# Patient Record
Sex: Male | Born: 1960
Health system: Southern US, Community
[De-identification: ages and names within clinical notes are randomized; demographics above are authoritative.]

## PROBLEM LIST (undated history)

## (undated) DIAGNOSIS — F17200 Nicotine dependence, unspecified, uncomplicated: Secondary | ICD-10-CM

## (undated) DIAGNOSIS — M19019 Primary osteoarthritis, unspecified shoulder: Secondary | ICD-10-CM

## (undated) DIAGNOSIS — S42009A Fracture of unspecified part of unspecified clavicle, initial encounter for closed fracture: Secondary | ICD-10-CM

## (undated) DIAGNOSIS — J3489 Other specified disorders of nose and nasal sinuses: Secondary | ICD-10-CM

## (undated) DIAGNOSIS — M5412 Radiculopathy, cervical region: Secondary | ICD-10-CM

## (undated) DIAGNOSIS — R12 Heartburn: Secondary | ICD-10-CM

## (undated) DIAGNOSIS — M479 Spondylosis, unspecified: Secondary | ICD-10-CM

## (undated) DIAGNOSIS — H9190 Unspecified hearing loss, unspecified ear: Secondary | ICD-10-CM

## (undated) DIAGNOSIS — Z87442 Personal history of urinary calculi: Secondary | ICD-10-CM

## (undated) DIAGNOSIS — N200 Calculus of kidney: Secondary | ICD-10-CM

## (undated) DIAGNOSIS — I10 Essential (primary) hypertension: Secondary | ICD-10-CM

## (undated) DIAGNOSIS — F329 Major depressive disorder, single episode, unspecified: Secondary | ICD-10-CM

## (undated) DIAGNOSIS — M4802 Spinal stenosis, cervical region: Secondary | ICD-10-CM

## (undated) DIAGNOSIS — F32A Depression, unspecified: Secondary | ICD-10-CM

## (undated) HISTORY — DX: Personal history of urinary calculi: Z87.442

## (undated) HISTORY — DX: Nicotine dependence, unspecified, uncomplicated: F17.200

## (undated) HISTORY — DX: Spinal stenosis, cervical region: M48.02

## (undated) HISTORY — DX: Other specified disorders of nose and nasal sinuses: J34.89

## (undated) HISTORY — DX: Radiculopathy, cervical region: M54.12

## (undated) HISTORY — PX: HAND SURGERY: SHX662

## (undated) HISTORY — DX: Spondylosis, unspecified: M47.9

## (undated) HISTORY — DX: Primary osteoarthritis, unspecified shoulder: M19.019

## (undated) HISTORY — PX: TONSILLECTOMY: SUR1361

## (undated) HISTORY — PX: OTHER SURGICAL HISTORY: SHX169

## (undated) HISTORY — PX: CHOLECYSTECTOMY: SHX55

## (undated) HISTORY — PX: KNEE SURGERY: SHX244

## (undated) HISTORY — PX: LITHOTRIPSY: SUR834

---

## 2003-02-08 ENCOUNTER — Encounter: Payer: Self-pay | Admitting: Emergency Medicine

## 2003-02-08 ENCOUNTER — Emergency Department (HOSPITAL_COMMUNITY): Admission: EM | Admit: 2003-02-08 | Discharge: 2003-02-08 | Payer: Self-pay | Admitting: Emergency Medicine

## 2005-04-11 ENCOUNTER — Emergency Department (HOSPITAL_COMMUNITY): Admission: EM | Admit: 2005-04-11 | Discharge: 2005-04-12 | Payer: Self-pay | Admitting: Emergency Medicine

## 2005-10-20 ENCOUNTER — Observation Stay (HOSPITAL_COMMUNITY): Admission: EM | Admit: 2005-10-20 | Discharge: 2005-10-21 | Payer: Self-pay | Admitting: Emergency Medicine

## 2005-10-21 ENCOUNTER — Encounter (INDEPENDENT_AMBULATORY_CARE_PROVIDER_SITE_OTHER): Payer: Self-pay | Admitting: *Deleted

## 2006-01-22 ENCOUNTER — Emergency Department (HOSPITAL_COMMUNITY): Admission: EM | Admit: 2006-01-22 | Discharge: 2006-01-22 | Payer: Self-pay | Admitting: Emergency Medicine

## 2006-02-07 ENCOUNTER — Emergency Department (HOSPITAL_COMMUNITY): Admission: EM | Admit: 2006-02-07 | Discharge: 2006-02-07 | Payer: Self-pay | Admitting: Emergency Medicine

## 2006-02-17 ENCOUNTER — Emergency Department (HOSPITAL_COMMUNITY): Admission: EM | Admit: 2006-02-17 | Discharge: 2006-02-17 | Payer: Self-pay | Admitting: Emergency Medicine

## 2006-08-25 ENCOUNTER — Emergency Department (HOSPITAL_COMMUNITY): Admission: EM | Admit: 2006-08-25 | Discharge: 2006-08-25 | Payer: Self-pay | Admitting: Emergency Medicine

## 2006-09-29 ENCOUNTER — Ambulatory Visit (HOSPITAL_BASED_OUTPATIENT_CLINIC_OR_DEPARTMENT_OTHER): Admission: RE | Admit: 2006-09-29 | Discharge: 2006-09-29 | Payer: Self-pay | Admitting: Orthopedic Surgery

## 2006-10-06 ENCOUNTER — Ambulatory Visit: Payer: Self-pay | Admitting: Internal Medicine

## 2006-11-24 ENCOUNTER — Emergency Department (HOSPITAL_COMMUNITY): Admission: EM | Admit: 2006-11-24 | Discharge: 2006-11-24 | Payer: Self-pay | Admitting: Emergency Medicine

## 2007-02-14 ENCOUNTER — Ambulatory Visit: Payer: Self-pay | Admitting: Internal Medicine

## 2007-02-14 DIAGNOSIS — J3489 Other specified disorders of nose and nasal sinuses: Secondary | ICD-10-CM | POA: Insufficient documentation

## 2007-02-14 DIAGNOSIS — F17200 Nicotine dependence, unspecified, uncomplicated: Secondary | ICD-10-CM

## 2007-02-14 DIAGNOSIS — M5412 Radiculopathy, cervical region: Secondary | ICD-10-CM | POA: Insufficient documentation

## 2007-02-17 ENCOUNTER — Encounter: Admission: RE | Admit: 2007-02-17 | Discharge: 2007-02-17 | Payer: Self-pay | Admitting: Internal Medicine

## 2007-02-20 ENCOUNTER — Encounter: Payer: Self-pay | Admitting: Internal Medicine

## 2007-02-22 ENCOUNTER — Encounter (INDEPENDENT_AMBULATORY_CARE_PROVIDER_SITE_OTHER): Payer: Self-pay | Admitting: *Deleted

## 2007-03-07 ENCOUNTER — Telehealth (INDEPENDENT_AMBULATORY_CARE_PROVIDER_SITE_OTHER): Payer: Self-pay | Admitting: *Deleted

## 2007-03-18 ENCOUNTER — Ambulatory Visit: Payer: Self-pay | Admitting: Internal Medicine

## 2007-03-18 DIAGNOSIS — M4802 Spinal stenosis, cervical region: Secondary | ICD-10-CM | POA: Insufficient documentation

## 2007-03-18 DIAGNOSIS — M19019 Primary osteoarthritis, unspecified shoulder: Secondary | ICD-10-CM | POA: Insufficient documentation

## 2007-03-18 DIAGNOSIS — M479 Spondylosis, unspecified: Secondary | ICD-10-CM | POA: Insufficient documentation

## 2007-03-30 ENCOUNTER — Ambulatory Visit (HOSPITAL_BASED_OUTPATIENT_CLINIC_OR_DEPARTMENT_OTHER): Admission: RE | Admit: 2007-03-30 | Discharge: 2007-03-30 | Payer: Self-pay | Admitting: Orthopedic Surgery

## 2007-04-07 ENCOUNTER — Emergency Department (HOSPITAL_COMMUNITY): Admission: EM | Admit: 2007-04-07 | Discharge: 2007-04-07 | Payer: Self-pay | Admitting: Emergency Medicine

## 2007-04-08 ENCOUNTER — Encounter: Payer: Self-pay | Admitting: Internal Medicine

## 2007-05-27 ENCOUNTER — Emergency Department (HOSPITAL_COMMUNITY): Admission: EM | Admit: 2007-05-27 | Discharge: 2007-05-28 | Payer: Self-pay | Admitting: Emergency Medicine

## 2007-08-09 ENCOUNTER — Telehealth (INDEPENDENT_AMBULATORY_CARE_PROVIDER_SITE_OTHER): Payer: Self-pay | Admitting: *Deleted

## 2007-08-10 ENCOUNTER — Encounter (INDEPENDENT_AMBULATORY_CARE_PROVIDER_SITE_OTHER): Payer: Self-pay | Admitting: *Deleted

## 2007-08-10 ENCOUNTER — Ambulatory Visit: Payer: Self-pay | Admitting: Internal Medicine

## 2007-08-10 DIAGNOSIS — K644 Residual hemorrhoidal skin tags: Secondary | ICD-10-CM

## 2007-08-10 DIAGNOSIS — Z87442 Personal history of urinary calculi: Secondary | ICD-10-CM

## 2007-08-31 ENCOUNTER — Telehealth (INDEPENDENT_AMBULATORY_CARE_PROVIDER_SITE_OTHER): Payer: Self-pay | Admitting: *Deleted

## 2007-09-01 ENCOUNTER — Telehealth (INDEPENDENT_AMBULATORY_CARE_PROVIDER_SITE_OTHER): Payer: Self-pay | Admitting: *Deleted

## 2007-09-02 ENCOUNTER — Telehealth: Payer: Self-pay | Admitting: Internal Medicine

## 2007-09-06 ENCOUNTER — Encounter (INDEPENDENT_AMBULATORY_CARE_PROVIDER_SITE_OTHER): Payer: Self-pay | Admitting: *Deleted

## 2007-09-07 ENCOUNTER — Telehealth (INDEPENDENT_AMBULATORY_CARE_PROVIDER_SITE_OTHER): Payer: Self-pay | Admitting: *Deleted

## 2007-10-04 ENCOUNTER — Telehealth: Payer: Self-pay | Admitting: Internal Medicine

## 2007-10-10 ENCOUNTER — Telehealth (INDEPENDENT_AMBULATORY_CARE_PROVIDER_SITE_OTHER): Payer: Self-pay | Admitting: *Deleted

## 2007-10-26 ENCOUNTER — Ambulatory Visit: Payer: Self-pay | Admitting: Internal Medicine

## 2007-11-09 ENCOUNTER — Encounter: Payer: Self-pay | Admitting: Internal Medicine

## 2007-11-11 ENCOUNTER — Emergency Department (HOSPITAL_COMMUNITY): Admission: EM | Admit: 2007-11-11 | Discharge: 2007-11-11 | Payer: Self-pay | Admitting: Emergency Medicine

## 2008-01-16 ENCOUNTER — Ambulatory Visit: Payer: Self-pay | Admitting: Internal Medicine

## 2008-01-16 DIAGNOSIS — K625 Hemorrhage of anus and rectum: Secondary | ICD-10-CM

## 2008-01-16 DIAGNOSIS — K219 Gastro-esophageal reflux disease without esophagitis: Secondary | ICD-10-CM | POA: Insufficient documentation

## 2008-01-18 ENCOUNTER — Encounter (INDEPENDENT_AMBULATORY_CARE_PROVIDER_SITE_OTHER): Payer: Self-pay | Admitting: *Deleted

## 2008-01-18 LAB — CONVERTED CEMR LAB
Basophils Absolute: 0.1 10*3/uL (ref 0.0–0.1)
Basophils Relative: 0.8 % (ref 0.0–3.0)
Eosinophils Relative: 2 % (ref 0.0–5.0)
Hemoglobin: 16.1 g/dL (ref 13.0–17.0)
MCHC: 34.8 g/dL (ref 30.0–36.0)
MCV: 89.9 fL (ref 78.0–100.0)
Neutro Abs: 4.7 10*3/uL (ref 1.4–7.7)
RBC: 5.14 M/uL (ref 4.22–5.81)
WBC: 8.4 10*3/uL (ref 4.5–10.5)

## 2008-01-19 ENCOUNTER — Encounter (INDEPENDENT_AMBULATORY_CARE_PROVIDER_SITE_OTHER): Payer: Self-pay | Admitting: *Deleted

## 2008-01-30 ENCOUNTER — Telehealth (INDEPENDENT_AMBULATORY_CARE_PROVIDER_SITE_OTHER): Payer: Self-pay | Admitting: *Deleted

## 2008-02-01 ENCOUNTER — Encounter: Payer: Self-pay | Admitting: Internal Medicine

## 2008-02-06 ENCOUNTER — Ambulatory Visit: Payer: Self-pay | Admitting: Internal Medicine

## 2008-11-19 ENCOUNTER — Ambulatory Visit: Payer: Self-pay | Admitting: Internal Medicine

## 2008-11-19 DIAGNOSIS — F528 Other sexual dysfunction not due to a substance or known physiological condition: Secondary | ICD-10-CM | POA: Insufficient documentation

## 2008-11-19 DIAGNOSIS — R1011 Right upper quadrant pain: Secondary | ICD-10-CM | POA: Insufficient documentation

## 2008-11-20 ENCOUNTER — Telehealth: Payer: Self-pay | Admitting: Internal Medicine

## 2008-11-21 ENCOUNTER — Encounter (INDEPENDENT_AMBULATORY_CARE_PROVIDER_SITE_OTHER): Payer: Self-pay | Admitting: *Deleted

## 2008-11-21 LAB — CONVERTED CEMR LAB
AST: 20 units/L (ref 0–37)
Albumin: 4 g/dL (ref 3.5–5.2)
Alkaline Phosphatase: 80 units/L (ref 39–117)
Basophils Absolute: 0 10*3/uL (ref 0.0–0.1)
Basophils Relative: 0.7 % (ref 0.0–3.0)
Eosinophils Absolute: 0.3 10*3/uL (ref 0.0–0.7)
HCT: 43.3 % (ref 39.0–52.0)
Hemoglobin: 15.1 g/dL (ref 13.0–17.0)
Lipase: 79 units/L — ABNORMAL HIGH (ref 11.0–59.0)
MCHC: 35 g/dL (ref 30.0–36.0)
Monocytes Absolute: 0.4 10*3/uL (ref 0.1–1.0)
Neutrophils Relative %: 49.7 % (ref 43.0–77.0)
RBC: 4.99 M/uL (ref 4.22–5.81)
Total Bilirubin: 0.9 mg/dL (ref 0.3–1.2)
Total Protein: 7.1 g/dL (ref 6.0–8.3)
WBC: 6.6 10*3/uL (ref 4.5–10.5)

## 2008-11-23 ENCOUNTER — Encounter (INDEPENDENT_AMBULATORY_CARE_PROVIDER_SITE_OTHER): Payer: Self-pay | Admitting: *Deleted

## 2008-12-06 ENCOUNTER — Telehealth (INDEPENDENT_AMBULATORY_CARE_PROVIDER_SITE_OTHER): Payer: Self-pay | Admitting: *Deleted

## 2009-01-23 ENCOUNTER — Ambulatory Visit: Payer: Self-pay | Admitting: Internal Medicine

## 2009-01-23 DIAGNOSIS — K5900 Constipation, unspecified: Secondary | ICD-10-CM | POA: Insufficient documentation

## 2009-01-26 LAB — CONVERTED CEMR LAB
ALT: 17 units/L (ref 0–53)
AST: 19 units/L (ref 0–37)
Albumin: 4 g/dL (ref 3.5–5.2)
Amylase: 87 units/L (ref 27–131)
Bilirubin, Direct: 0 mg/dL (ref 0.0–0.3)
Hemoglobin: 15.7 g/dL (ref 13.0–17.0)
MCHC: 34.5 g/dL (ref 30.0–36.0)
Platelets: 189 10*3/uL (ref 150.0–400.0)
RBC: 5.16 M/uL (ref 4.22–5.81)
RDW: 12.4 % (ref 11.5–14.6)

## 2009-01-28 ENCOUNTER — Encounter (INDEPENDENT_AMBULATORY_CARE_PROVIDER_SITE_OTHER): Payer: Self-pay | Admitting: *Deleted

## 2009-01-28 ENCOUNTER — Encounter: Payer: Self-pay | Admitting: Internal Medicine

## 2009-06-28 ENCOUNTER — Ambulatory Visit: Payer: Self-pay | Admitting: Internal Medicine

## 2009-08-05 ENCOUNTER — Telehealth: Payer: Self-pay | Admitting: Internal Medicine

## 2009-08-06 ENCOUNTER — Telehealth (INDEPENDENT_AMBULATORY_CARE_PROVIDER_SITE_OTHER): Payer: Self-pay | Admitting: *Deleted

## 2009-12-19 ENCOUNTER — Ambulatory Visit: Payer: Self-pay | Admitting: Internal Medicine

## 2009-12-19 DIAGNOSIS — K921 Melena: Secondary | ICD-10-CM | POA: Insufficient documentation

## 2009-12-19 LAB — CONVERTED CEMR LAB
ALT: 15 units/L (ref 0–53)
AST: 18 units/L (ref 0–37)
Amylase: 71 units/L (ref 27–131)
BUN: 21 mg/dL (ref 6–23)
Bilirubin, Direct: 0.1 mg/dL (ref 0.0–0.3)
Chloride: 104 meq/L (ref 96–112)
Creatinine, Ser: 0.9 mg/dL (ref 0.4–1.5)
Eosinophils Absolute: 0.2 10*3/uL (ref 0.0–0.7)
Eosinophils Relative: 2.1 % (ref 0.0–5.0)
GFR calc non Af Amer: 92.65 mL/min (ref >60)
Glucose, Bld: 90 mg/dL (ref 70–99)
HCT: 45.2 % (ref 39.0–52.0)
Hemoglobin, Urine: NEGATIVE
Hemoglobin: 15.9 g/dL (ref 13.0–17.0)
Leukocytes, UA: NEGATIVE
Lipase: 31 units/L (ref 11.0–59.0)
Monocytes Relative: 6.4 % (ref 3.0–12.0)
Platelets: 237 10*3/uL (ref 150.0–400.0)
RBC: 5.17 M/uL (ref 4.22–5.81)
TSH: 0.8 microintl units/mL (ref 0.35–5.50)
Total Bilirubin: 0.6 mg/dL (ref 0.3–1.2)

## 2009-12-23 ENCOUNTER — Encounter (INDEPENDENT_AMBULATORY_CARE_PROVIDER_SITE_OTHER): Payer: Self-pay | Admitting: *Deleted

## 2010-05-18 ENCOUNTER — Encounter: Payer: Self-pay | Admitting: Internal Medicine

## 2010-05-27 NOTE — Progress Notes (Signed)
Summary: pt returned call msg given  Phone Note Call from Patient Call back at [phone]   Caller: Patient Summary of Call: PATIENT  CAME IN WANTING SOMETHING TO BE CALL IN FOR THE SWELLING FOR HIS EAR. HE SAID HIS FRIEND SAID HE NEED THIS MED. AND TO ASK FOR THIS. PLEASE CALL WHEN READY.  Initial call taken by: Freddy Jaksch,  August 06, 2009 3:31 PM  Follow-up for Phone Call        His ENT surgeon must clear all meds to prevent adverse post op complications. Follow-up by: Marga Melnick MD,  August 06, 2009 6:14 PM  Additional Follow-up for Phone Call Additional follow up Details #1::        Pt was call back about the med and no one ask the phone at this time. message was left for pt to call the office. Additional Follow-up by: Freddy Jaksch,  August 07, 2009 9:52 AM    Additional Follow-up for Phone Call Additional follow up Details #2::    patient called back via relay -patient was given msg from dr hopper  Follow-up by: Okey Regal [place],  August 07, 2009 2:39 PM

## 2010-05-27 NOTE — Assessment & Plan Note (Signed)
Summary: for a pneumonia shot--ph  Nurse Visit   Allergies: 1)  ! Vicodin  Immunizations Administered:  Pneumonia Vaccine:    Vaccine Type: Pneumovax    Site: left deltoid    Mfr: Merck    Dose: 0.5 ml    Route: IM    Given by: [employer]    Exp. Date: 08/15/2010    Lot #: [id number]  Orders Added: 1)  Pneumococcal Vaccine [90732] 2)  Admin 1st Vaccine [16109]

## 2010-05-27 NOTE — Progress Notes (Signed)
Summary: OXYCODONE REQUEST  Phone Note Call from Patient Call back at [phone] [facility]ICE   Caller: Patient Call For: Marga Melnick MD Details for Reason: Requesting Medication Details of Complaint: Pain after ear surgery Summary of Call: PATIENT CALLING THROUGH "VIDEO RELAY SERVICE" FOR HEARING IMPAIRED.  PATIENT JUST HAD SURGERY ON 07-30-2009, HAD  COCHLEAR IMPLANT.  PATIENT STATES THE PHYSICIAN WHO PERFORMED THE SURGERY PRESCRIBED HIM OXYCODONE 5/325, 1-2 EVERY 4 TO 6 HOURS AS NEEDED FOR PAIN.  THE PHYSICIAN WHO PRESCRIBED WAS IN [place], DR. PILLSBURY TOLD PATIENT TO CONTACT HIS FAMILY DR & HAVE HIM REFILL THE MED.  PATIENT ALSO STATES THE MED IS NOT STRONG ENOUGH & IS NOT RELIEVING HIS PAIN.  WOULD LIKE SOMETHING STRONGER.  IF DR. HOPPER NEEDS TO SPEAK W/ DR. PILLSBURY CALL [phone].  IF APPROVED [facility]END [facility], PH:  [phone], PT NOT SURE OF [facility]. Initial call taken by: Magdalen Spatz [facility],  August 05, 2009 1:38 PM  Follow-up for Phone Call        Dr.Hopper please advise Follow-up by: Shonna Chock,  August 05, 2009 1:59 PM  Additional Follow-up for Phone Call Additional follow up Details #1::        Oxycodone is strongest oral  pain medication .Dr Lenoria Farrier would need to FAX recommendations to me [phone]) or call me [phone]) Additional Follow-up by: Marga Melnick MD,  August 05, 2009 2:58 PM    Additional Follow-up for Phone Call Additional follow up Details #2::    I spoke with Dr. Lenoria Farrier and he is willing to give the patient one more week of oxycodone, nothing stronger, but he says normally after this kind of surgery its not needed.  Patient will have to go to pick up the prescription unless you are willing to give him just one week more of the oxycodone. Follow-up by: Barnie Mort,  August 05, 2009 3:31 PM  Additional Follow-up for Phone Call Additional follow up Details #3:: Details for Additional Follow-up Action Taken: see  Rx  Patient notified to pick up rx Barnie Mort  August 06, 2009 9:52 AM  Additional Follow-up by: Marga Melnick MD,  August 05, 2009 5:59 PM  New/Updated Medications: ROXICET 5-500 MG TABS (OXYCODONE-ACETAMINOPHEN) 1-2 every 4-6 hrs as needed Prescriptions: ROXICET 5-500 MG TABS (OXYCODONE-ACETAMINOPHEN) 1-2 every 4-6 hrs as needed  #30 x 0   Entered and Authorized by:   Marga Melnick MD   Signed by:   Marga Melnick MD on 08/05/2009   Method used:   Printed then faxed to ...       [facility]  [phone][facility] (retail)       [address]       [place], [place]  [id number]       Ph: [phone] or [phone]       Fax: [phone]   RxID:   [id number]

## 2010-05-27 NOTE — Assessment & Plan Note (Signed)
Summary: NEW/ MEDICARE/MEDICAID/NWS  #  INTERPRETER SCHEDULED/NWS   Vital Signs:  Patient profile:   50 year old male Height:      72 inches Weight:      176 pounds BMI:     23.96 O2 Sat:      97 % on Room air Temp:     98.5 degrees F oral Pulse rate:   69 / minute Pulse rhythm:   regular Resp:     16 per minute BP sitting:   118 / 90  (left arm) Cuff size:   regular  Vitals Entered By: Lanier Prude, CMA(AAMA) (December 19, 2009 2:32 PM)  O2 Flow:  Room air CC: masses on Abd , Abdominal pain, Preventive Care Is Patient Diabetic? No Pain Assessment Patient in pain? no      Comments pt wants second opinion on masses on abdomen.  He states he was informed they were fat tumors but just wants further eval. he is requesting rf on Bupropion Hcl 150mg    Primary Care Provider:  Etta Grandchild MD  CC:  masses on Abd , Abdominal pain, and Preventive Care.  History of Present Illness:  Abdominal Pain      This is a 50 year old man who presents with Abdominal pain.  The symptoms began >1 year ago.  On a scale of 1 to 10, the intensity is described as a 3.  The patient reports hematochezia, but denies nausea, vomiting, diarrhea, constipation, melena, anorexia, and hematemesis.  The location of the pain is right upper quadrant.  The pain is described as intermittent and sharp.  The patient denies the following symptoms: fever, weight loss, dysuria, chest pain, jaundice, and dark urine.    Preventive Screening-Counseling & Management  Alcohol-Tobacco     Alcohol drinks/day: 0     Smoking Status: current     Smoking Cessation Counseling: yes     Tobacco Counseling: to quit use of tobacco products  Caffeine-Diet-Exercise     Does Patient Exercise: yes  Hep-HIV-STD-Contraception     Hepatitis Risk: no risk noted     HIV Risk: no risk noted     STD Risk: no risk noted     TSE monthly: yes     Testicular SE Education/Counseling to perform regular STE      Sexual History:  not  active.        Drug Use:  never.        Blood Transfusions:  no.    Clinical Review Panels:  CBC   WBC:  5.6 (01/23/2009)   RBC:  5.16 (01/23/2009)   Hgb:  15.7 (01/23/2009)   Hct:  45.4 (01/23/2009)   Platelets:  189.0 (01/23/2009)   MCV  88.1 (01/23/2009)   MCHC  34.5 (01/23/2009)   RDW  12.4 (01/23/2009)   PMN:  49.7 (11/19/2008)   Lymphs:  38.6 (11/19/2008)   Monos:  6.3 (11/19/2008)   Eosinophils:  4.7 (11/19/2008)   Basophil:  0.7 (11/19/2008)  Complete Metabolic Panel   Albumin:  4.0 (01/23/2009)   Total Protein:  6.7 (01/23/2009)   Total Bili:  0.5 (01/23/2009)   Alk Phos:  75 (01/23/2009)   SGPT (ALT):  17 (01/23/2009)   SGOT (AST):  19 (01/23/2009)   Medications Prior to Update: 1)  Zantac 150 Maximum Strength 150 Mg Tabs (Ranitidine Hcl) .Marland Kitchen.. 1 Every 12 Hrs As Needed For Heartburn 2)  Bupropion Hcl (Smoking Deter) 150 Mg Xr12h-Tab (Bupropion Hcl (Smoking Deter)) .Marland Kitchen.. 1q Am X  5 Days Then 2 Q Am If Needed 3)  Ranitidine Hcl 150 Mg Tabs (Ranitidine Hcl) .Marland Kitchen.. 1 Two Times A Day 15-30 Min Pre Meals As Needed 4)  Dicyclomine Hcl 10 Mg Caps (Dicyclomine Hcl) .Marland Kitchen.. 1-2 Q 6 Hrs As Needed Abdominal Pain 5)  Roxicet 5-500 Mg Tabs (Oxycodone-Acetaminophen) .Marland Kitchen.. 1-2 Every 4-6 Hrs As Needed  Current Medications (verified): 1)  Bupropion Hcl (Smoking Deter) 150 Mg Xr12h-Tab (Bupropion Hcl (Smoking Deter)) .Marland Kitchen.. 1q Am X 5 Days Then 2 Q Am If Needed  Allergies (verified): 1)  ! Vicodin  Past History:  Past Medical History: Last updated: 01/23/2009 OSTEOARTHRITIS, SHOULDER, LEFT (ICD-715.91) SPONDYLOSIS UNSPEC SITE W/O MENTION MYELOPATHY (ICD-721.90) SPINAL STENOSIS, CERVICAL (ICD-723.0) DISORDER, TOBACCO USE (ICD-305.1) CERVICAL RADICULOPATHY (ICD-723.4) DISEASE, NASAL CAVITY/SINUS NEC (ICD-478.19) Nephrolithiasis, hx of  Past Surgical History: Last updated: 08/10/2007 L thumb surgery; knee surgery; R hand surgery Cholecystectomy Tonsillectomy  Family  History: Last updated: 12/19/2009 paternal grandfather  CABG maternal grand father cva's  Social History: Last updated: 12/19/2009 Current Smoker 1 pack a day Alcohol use-yes occasional Convicted sex offender [employer] Regular exercise-yes  Risk Factors: Alcohol Use: 0 (12/19/2009) Exercise: yes (12/19/2009)  Risk Factors: Smoking Status: current (12/19/2009)  Family History: Reviewed history from 08/10/2007 and no changes required. paternal grandfather  CABG maternal grand father cva's  Social History: Reviewed history from 08/10/2007 and no changes required. Current Smoker 1 pack a day Alcohol use-yes occasional Convicted sex offender [employer] Regular exercise-yes Does Patient Exercise:  yes Hepatitis Risk:  no risk noted HIV Risk:  no risk noted STD Risk:  no risk noted Drug Use:  never Blood Transfusions:  no Sexual History:  not active  Review of Systems       The patient complains of abdominal pain and hematochezia.  The patient denies anorexia, fever, weight loss, weight gain, chest pain, syncope, dyspnea on exertion, peripheral edema, prolonged cough, headaches, hemoptysis, melena, severe indigestion/heartburn, hematuria, suspicious skin lesions, difficulty walking, depression, abnormal bleeding, enlarged lymph nodes, angioedema, and testicular masses.    Physical Exam  General:  alert, well-developed, well-nourished, well-hydrated, appropriate dress, normal appearance, healthy-appearing, and cooperative to examination.   Head:  normocephalic, atraumatic, no abnormalities observed, and no abnormalities palpated.   Eyes:  pink moist mm., no icterus Ears:  R ear normal and L ear normal.   Mouth:  Oral mucosa and oropharynx without lesions or exudates.  Teeth in good repair. Neck:  supple, full ROM, no masses, no thyromegaly, no thyroid nodules or tenderness, normal carotid upstroke, no carotid bruits, and no cervical lymphadenopathy.   Lungs:  normal  respiratory effort, no intercostal retractions, no accessory muscle use, normal breath sounds, no dullness, no fremitus, no crackles, and no wheezes.   Heart:  normal rate, regular rhythm, no murmur, no gallop, no rub, and no JVD.   Abdomen:  soft, non-tender, normal bowel sounds, no distention, no masses, no guarding, no rigidity, no rebound tenderness, no abdominal hernia, no inguinal hernia, no hepatomegaly, no splenomegaly, and abdominal scar(s).   Rectal:  no external abnormalities, normal sphincter tone, no masses, no tenderness, no fissures, no fistulae, no perianal rash, and internal hemorrhoid(s).   Genitalia:  circumcised, no hydrocele, no varicocele, no scrotal masses, no testicular masses or atrophy, no cutaneous lesions, and no urethral discharge.   Prostate:  Prostate gland firm and smooth, no enlargement, nodularity, tenderness, mass, asymmetry or induration. Msk:  normal ROM, no joint tenderness, no joint swelling, no joint warmth, no redness over joints, no  joint deformities, no joint instability, no crepitation, and no muscle atrophy.   Pulses:  R and L carotid,radial,femoral,dorsalis pedis and posterior tibial pulses are full and equal bilaterally Extremities:  No clubbing, cyanosis, edema, or deformity noted with normal full range of motion of all joints.   Neurologic:  No cranial nerve deficits noted. Station and gait are normal. Plantar reflexes are down-going bilaterally. DTRs are symmetrical throughout. Sensory, motor and coordinative functions appear intact. Skin:  turgor normal, color normal, no rashes, no suspicious lesions, no ecchymoses, no petechiae, no purpura, no ulcerations, no edema, tattoo(s), excessive tan, and solar damage.  he has several lipomas but no pathological lesions subcutaneously. Cervical Nodes:  no anterior cervical adenopathy and no posterior cervical adenopathy.   Axillary Nodes:  no R axillary adenopathy and no L axillary adenopathy.   Inguinal Nodes:   no R inguinal adenopathy and no L inguinal adenopathy.   Psych:  Cognition and judgment appear intact. Alert and cooperative with normal attention span and concentration. No apparent delusions, illusions, hallucinations   Impression & Recommendations:  Problem # 1:  HEMATOCHEZIA (ICD-578.1) Assessment New he may need endoscopy Orders: Gastroenterology Referral (GI) Hemoccult Guaiac-1 spec.(in office) (82270)  Problem # 2:  ABDOMINAL PAIN, RIGHT UPPER QUADRANT (ICD-789.01) Assessment: Unchanged will look at his labs again, almost one year ago his amylase was slightly elevated, I will also look at an U/S of RUQ to see if there are any hepatic masses, retained stones, dilated ducts Orders: Venipuncture (09323) TLB-BMP (Basic Metabolic Panel-BMET) (80048-METABOL) TLB-CBC Platelet - w/Differential (85025-CBCD) TLB-Hepatic/Liver Function Pnl (80076-HEPATIC) TLB-TSH (Thyroid Stimulating Hormone) (84443-TSH) TLB-Amylase (82150-AMYL) TLB-Lipase (83690-LIPASE) TLB-Udip w/ Micro (81001-URINE) Radiology Referral (Radiology)  Problem # 3:  CIGARETTE SMOKER (ICD-305.1) Assessment: Deteriorated  will try bupropion again- he says that this helped in the past His updated medication list for this problem includes:    Bupropion Hcl (smoking Deter) 150 Mg Xr12h-tab (Bupropion hcl (smoking deter)) .Marland Kitchen... 1q am x 5 days then 2 q am if needed  Orders: Tobacco use cessation intermediate 3-10 minutes (55732)  Complete Medication List: 1)  Bupropion Hcl (smoking Deter) 150 Mg Xr12h-tab (Bupropion hcl (smoking deter)) .Marland Kitchen.. 1q am x 5 days then 2 q am if needed  Colorectal Screening:  Current Recommendations:    Hemoccult: NEG X 1 today  Immunization & Chemoprophylaxis:    Influenza vaccine: Fluvax 3+  (02/06/2008)    Pneumovax: Pneumovax  (06/28/2009)  Patient Instructions: 1)  Please schedule a follow-up appointment in 4 months. 2)  Tobacco is very bad for your health and your loved ones! You  Should stop smoking!. 3)  Stop Smoking Tips: Choose a Quit date. Cut down before the Quit date. decide what you will do as a substitute when you feel the urge to smoke(gum,toothpick,exercise). Prescriptions: BUPROPION HCL (SMOKING DETER) 150 MG XR12H-TAB (BUPROPION HCL (SMOKING DETER)) 1q am X 5 days then 2 q am if needed  #60 x 11   Entered and Authorized by:   Etta Grandchild MD   Signed by:   Etta Grandchild MD on 12/19/2009   Method used:   Print then Give to Patient   RxID:   [id number]

## 2010-05-27 NOTE — Letter (Signed)
Summary: [facility] Consult Scheduled Letter  [facility]  [address] [place], [place] [address]   Phone: [phone]  Fax: [phone]      12/23/2009 MRN: [id number]  [facility] [address] [place], [place]  [address]    Dear [name],      We have scheduled an appointment for you.  At the recommendation of Dr.Jones, we have scheduled you a consult with Dr Leone Payor on 02/03/10 at 2:00pm.  Their phone number is [phone].  If this appointment day and time is not convenient for you, please feel free to call the office of the doctor you are being referred to at the number listed above and reschedule the appointment.    [facility] [address] [place], [place] [address] *Gastroenterology Dept.3rd Floor*    Please give 24hr notice if you need to cancel/reschedule to avoid a $50.00 fee.Also bring insurance card and any co-pay due at time of visit.    Thank you,  Patient Care Coordinator [facility]

## 2010-07-10 ENCOUNTER — Inpatient Hospital Stay (HOSPITAL_COMMUNITY)
Admission: EM | Admit: 2010-07-10 | Discharge: 2010-07-14 | DRG: 660 | Disposition: A | Discharge status: Home / Self Care | Payer: Medicare Other | Attending: Internal Medicine | Admitting: Internal Medicine

## 2010-07-10 ENCOUNTER — Emergency Department (HOSPITAL_COMMUNITY): Payer: Medicare Other

## 2010-07-10 DIAGNOSIS — N12 Tubulo-interstitial nephritis, not specified as acute or chronic: Secondary | ICD-10-CM | POA: Diagnosis present

## 2010-07-10 DIAGNOSIS — N201 Calculus of ureter: Principal | ICD-10-CM | POA: Diagnosis present

## 2010-07-10 DIAGNOSIS — F17200 Nicotine dependence, unspecified, uncomplicated: Secondary | ICD-10-CM | POA: Diagnosis present

## 2010-07-10 DIAGNOSIS — N1330 Unspecified hydronephrosis: Secondary | ICD-10-CM | POA: Diagnosis present

## 2010-07-10 DIAGNOSIS — N2889 Other specified disorders of kidney and ureter: Secondary | ICD-10-CM | POA: Diagnosis present

## 2010-07-10 DIAGNOSIS — F1210 Cannabis abuse, uncomplicated: Secondary | ICD-10-CM | POA: Diagnosis present

## 2010-07-10 DIAGNOSIS — K649 Unspecified hemorrhoids: Secondary | ICD-10-CM | POA: Diagnosis present

## 2010-07-10 DIAGNOSIS — H9190 Unspecified hearing loss, unspecified ear: Secondary | ICD-10-CM | POA: Diagnosis present

## 2010-07-10 DIAGNOSIS — N200 Calculus of kidney: Secondary | ICD-10-CM | POA: Diagnosis present

## 2010-07-10 LAB — URINALYSIS, ROUTINE W REFLEX MICROSCOPIC
Bilirubin Urine: NEGATIVE
Glucose, UA: NEGATIVE mg/dL
Hgb urine dipstick: NEGATIVE
Ketones, ur: NEGATIVE mg/dL
Urobilinogen, UA: 0.2 mg/dL (ref 0.0–1.0)
pH: 5.5 (ref 5.0–8.0)

## 2010-07-10 LAB — DIFFERENTIAL
Basophils Absolute: 0 10*3/uL (ref 0.0–0.1)
Eosinophils Relative: 1 % (ref 0–5)
Lymphocytes Relative: 12 % (ref 12–46)
Monocytes Absolute: 1.2 10*3/uL — ABNORMAL HIGH (ref 0.1–1.0)
Monocytes Relative: 7 % (ref 3–12)
Neutrophils Relative %: 80 % — ABNORMAL HIGH (ref 43–77)

## 2010-07-10 LAB — CBC
HCT: 42.2 % (ref 39.0–52.0)
MCV: 83.1 fL (ref 78.0–100.0)
Platelets: 268 10*3/uL (ref 150–400)
WBC: 16.7 10*3/uL — ABNORMAL HIGH (ref 4.0–10.5)

## 2010-07-10 LAB — COMPREHENSIVE METABOLIC PANEL
BUN: 19 mg/dL (ref 6–23)
Creatinine, Ser: 1.57 mg/dL — ABNORMAL HIGH (ref 0.4–1.5)
Glucose, Bld: 95 mg/dL (ref 70–99)
Potassium: 4 mEq/L (ref 3.5–5.1)

## 2010-07-11 LAB — CROSSMATCH: ABO/RH(D): B POS

## 2010-07-11 LAB — CBC
HCT: 39.2 % (ref 39.0–52.0)
Hemoglobin: 14 g/dL (ref 13.0–17.0)
MCH: 29.6 pg (ref 26.0–34.0)
MCHC: 35.7 g/dL (ref 30.0–36.0)
MCV: 82.9 fL (ref 78.0–100.0)
Platelets: 232 10*3/uL (ref 150–400)

## 2010-07-11 LAB — GLUCOSE, CAPILLARY
Glucose-Capillary: 107 mg/dL — ABNORMAL HIGH (ref 70–99)
Glucose-Capillary: 89 mg/dL (ref 70–99)

## 2010-07-11 LAB — COMPREHENSIVE METABOLIC PANEL
Calcium: 8.9 mg/dL (ref 8.4–10.5)
Chloride: 107 mEq/L (ref 96–112)
Creatinine, Ser: 1.59 mg/dL — ABNORMAL HIGH (ref 0.4–1.5)
GFR calc non Af Amer: 47 mL/min — ABNORMAL LOW (ref >60)
Glucose, Bld: 96 mg/dL (ref 70–99)
Sodium: 138 mEq/L (ref 135–145)
Total Bilirubin: 1.2 mg/dL (ref 0.3–1.2)

## 2010-07-11 LAB — ABO/RH: ABO/RH(D): B POS

## 2010-07-11 MED ORDER — IOHEXOL 300 MG/ML  SOLN
90.0000 mL | Freq: Once | INTRAMUSCULAR | Status: AC | PRN
  Administered 2010-07-11: 90 mL via INTRAVENOUS

## 2010-07-12 ENCOUNTER — Inpatient Hospital Stay (HOSPITAL_COMMUNITY): Payer: Medicare Other

## 2010-07-12 LAB — COMPREHENSIVE METABOLIC PANEL
ALT: 115 U/L — ABNORMAL HIGH (ref 0–53)
AST: 77 U/L — ABNORMAL HIGH (ref 0–37)
Albumin: 3.3 g/dL — ABNORMAL LOW (ref 3.5–5.2)
Alkaline Phosphatase: 131 U/L — ABNORMAL HIGH (ref 39–117)
BUN: 12 mg/dL (ref 6–23)
CO2: 24 mEq/L (ref 19–32)
Calcium: 9.1 mg/dL (ref 8.4–10.5)
Chloride: 109 mEq/L (ref 96–112)
Creatinine, Ser: 1.22 mg/dL (ref 0.4–1.5)
GFR calc Af Amer: >60 mL/min (ref >60)
GFR calc non Af Amer: >60 mL/min (ref >60)
Glucose, Bld: 97 mg/dL (ref 70–99)
Potassium: 4.1 mEq/L (ref 3.5–5.1)
Sodium: 139 mEq/L (ref 135–145)
Total Bilirubin: 0.8 mg/dL (ref 0.3–1.2)
Total Protein: 6 g/dL (ref 6.0–8.3)

## 2010-07-12 LAB — CBC
HCT: 38.1 % — ABNORMAL LOW (ref 39.0–52.0)
Hemoglobin: 12.9 g/dL — ABNORMAL LOW (ref 13.0–17.0)
MCH: 28 pg (ref 26.0–34.0)
MCHC: 33.9 g/dL (ref 30.0–36.0)
MCV: 82.8 fL (ref 78.0–100.0)
Platelets: 203 10*3/uL (ref 150–400)
RBC: 4.6 MIL/uL (ref 4.22–5.81)
RDW: 13 % (ref 11.5–15.5)
WBC: 9.8 10*3/uL (ref 4.0–10.5)

## 2010-07-12 LAB — PROTIME-INR
INR: 1.03 (ref 0.00–1.49)
Prothrombin Time: 13.7 seconds (ref 11.6–15.2)

## 2010-07-13 LAB — URINALYSIS, MICROSCOPIC ONLY
Glucose, UA: 100 mg/dL — AB
Nitrite: NEGATIVE
pH: 5 (ref 5.0–8.0)

## 2010-07-13 LAB — COMPREHENSIVE METABOLIC PANEL
BUN: 12 mg/dL (ref 6–23)
CO2: 26 mEq/L (ref 19–32)
Calcium: 9.3 mg/dL (ref 8.4–10.5)
Creatinine, Ser: 1.06 mg/dL (ref 0.4–1.5)
GFR calc non Af Amer: >60 mL/min (ref >60)
Glucose, Bld: 127 mg/dL — ABNORMAL HIGH (ref 70–99)
Total Protein: 6.5 g/dL (ref 6.0–8.3)

## 2010-07-13 LAB — CBC
HCT: 39.7 % (ref 39.0–52.0)
MCH: 30 pg (ref 26.0–34.0)
MCHC: 36 g/dL (ref 30.0–36.0)
MCV: 83.2 fL (ref 78.0–100.0)
RDW: 12.7 % (ref 11.5–15.5)

## 2010-07-13 LAB — DIFFERENTIAL
Basophils Absolute: 0 10*3/uL (ref 0.0–0.1)
Eosinophils Relative: 0 % (ref 0–5)
Lymphocytes Relative: 10 % — ABNORMAL LOW (ref 12–46)
Lymphs Abs: 0.9 10*3/uL (ref 0.7–4.0)
Monocytes Absolute: 0.4 10*3/uL (ref 0.1–1.0)
Monocytes Relative: 5 % (ref 3–12)

## 2010-07-14 LAB — URINE CULTURE
Culture  Setup Time: 201203181930
Culture: NO GROWTH

## 2010-07-15 NOTE — Discharge Summary (Signed)
NAMECHRISTOBAL, [name]               ACCOUNT NO.:  [id number]  MEDICAL RECORD NO.:  [id number]           PATIENT TYPE:  I  LOCATION:  4503                         FACILITY:  [facility]  PHYSICIAN:  Pincus Large, MD     DATE OF BIRTH:  1960-10-11  DATE OF ADMISSION:  07/10/2010 DATE OF DISCHARGE:  07/14/2010                              DISCHARGE SUMMARY   PRIMARY CARE PHYSICIAN:  At this time, the patient is unassigned.  He is to see Dr. Marga Melnick.  PRIMARY UROLOGIST:  Lindaann Slough, MD  REASON FOR ADMISSION:  Abdominal pain.  Time spent on discharge summary was 35 minutes.  DISCHARGE DIAGNOSES:  Mild right-sided hydronephrosis, multiple kidney stones, large irregular 1-cm stone at the lower lobe of the right kidney, 5-mm obstructing stone at the right retrobulbar extension at the level of the kidney, 3-mm stone in the bladder, pyelonephritis right sided, rectal bleed secondary to hemorrhoids.  MEDICATIONS AT DISCHARGE: 1. Cipro 500 p.o. x7 days b.i.d. 2. Anusol hydrocortisone cream q.6 p.r.n. 3. Oxycodone 5/325 one tablet q.6 p.r.n. 4. Tucks pads, one pad t.i.d. 5. Naproxen 220 mg by mouth q.6 p.r.n.  IMAGES THAT WAS DONE:  CT abdomen was done on July 11, 2010, which shows: 1. Mild right-sided hydronephrosis noted with significant perinephric     stranding and fluid.  There was a recent stone 5 mm at the base of     the bladder as well as 3-mm stone in the bladder that was passed     on.  There was a 1-mm stone still in the right vesicourethral     junction and there was also a 5-mm obstructing stone in the right     ureteropelvic junction. 2. Large irregular 1-cm stone in the left lower pole of the right     kidney. 3. Focal venous scarring and atrophy involving the anterior lower     quadrant of the left kidney. 4. Retrograde pyelograph which shows retrogram pyelogram of the distal     right ureter showing distal ureteric stone.  CONSULT THAT WAS DONE:  Urology  consult with Dr. Brunilda Payor.  PROCEDURE THAT WAS DONE:  Cystoscopy, right retrograde pyelography with interpretation, right urethroscopy with stone removal, right ureteral stent placement 6 x 24 done by Dr. Heloise Purpura.  VITALS ON DISCHARGE:  Temperature is 98, pulse is 57, respirations 20, blood pressure is 154/84, he is sating 96 on room air.  LABORATORY DATA THAT WAS DONE:  WBC on admission 16.7, went down to 8.9. His creatinine on admission 1.57, went down to 1.06.  His liver function on admission alkaline phosphatase was 142, his AST was 259.  ALT went down to 88, and AST went down to 44, alkaline phosphatase is 133. Urinalysis shows large LE, wbc 7-10, and rbc's too numerous to count  HOSPITAL COURSE BY PROBLEMS: 1. Multiple kidney stones/bladder stone.  The patient was placed on IV     fluids.  He received also IV Cipro.  His WBC went down.  He was     able to pass 3 small stones by himself.  Neurology consult was done  by Dr. Brunilda Payor and then he was followed up by Dr. Heloise Purpura who     placed a stent on the patient and he advised the patient to take     the stent.  The patient took the stent on Monday and he is feeling     much better.  No pain.  He is afebrile.  He is going home to follow     up with Dr. Brunilda Payor in 2-3 weeks. 2. Pyelonephritis.  Continue with p.o. antibiotics x7 days. 3. Rectal bleed secondary to hemorrhoids.  His H and H remained     stable.  He did feel better after using the sitz bath and Tucks     pads and Anusol cream.  The patient need to follow up with Surgery     as an outpatient for further management.          ______________________________ Pincus Large, MD     SA/MEDQ  D:  07/14/2010  T:  07/15/2010  Job:  [id number]  Electronically Signed by Pincus Large MD on 07/15/2010 03:53:50 PM

## 2010-07-23 NOTE — Op Note (Signed)
NAMEAVIEN, TAHA               ACCOUNT NO.:  [id number]  MEDICAL RECORD NO.:  [id number]           PATIENT TYPE:  LOCATION:                                 FACILITY:  PHYSICIAN:  Heloise Purpura, MD      DATE OF BIRTH:  1960/12/08  DATE OF PROCEDURE:  07/12/2010 DATE OF DISCHARGE:                              OPERATIVE REPORT   PREOPERATIVE DIAGNOSIS:  Right ureteropelvic junction calculus.  POSTOPERATIVE DIAGNOSIS:  Right distal ureteral calculi.  PROCEDURES: 1. Cystoscopy. 2. Right retrograde pyelography with interpretation. 3. Right ureteroscopy with stone removal. 4. Right ureteral stent placement (6 x 24).  SURGEON:  Heloise Purpura, MD  ANESTHESIA:  General.  COMPLICATIONS:  None.  INDICATION:  Mr. Caligiuri is a 50 year old gentleman who has been followed in consultation by Dr. Brunilda Payor.  He was scheduled to be discharge earlier today with plans to proceed with medical expulsion therapy for his stone.  Unfortunately, he developed worsening pain today and his pain was not well controlled with IV Dilaudid.  We, therefore, discussed options and elected to proceed with cystoscopy and probable ureteral stent placement.  Potential risks, complications, and alternative treatment options were discussed in detail and informed consent was obtained.  DESCRIPTION OF PROCEDURE:  The patient was taken to the operating room and a general anesthetic was administered.  He was given preoperative antibiotics, which had been administered prior to this surgery.  He was placed in the dorsal lithotomy position and prepped and draped in the usual sterile fashion.  Next, preoperative time-out was performed. Cystourethroscopy was then performed which revealed a normal anterior and posterior urethra.  Inspection of the bladder revealed no evidence of bladder tumors, stones, or other mucosal pathology except at the right ureteral orifice where a stone was seen to be crowning.  A 6- [place]  ureteral catheter was able to be inserted into the distal ureter next to the stone and Omnipaque contrast was injected which revealed 2 filling defects within the distal ureter, consistent with stones.  No other abnormalities were identified.  A 0.038 sensor guidewire was then inserted up into the right renal pelvis past the stone under fluoroscopic guidance.  A 6-French semi-rigid ureteroscope was then passed next to the wire into the distal ureter and 2 fragments were removed easily with a ZeroTip nitinol basket.  The semi-rigid ureteroscope was then passed all the way up into the proximal ureter with no further stones seen.  Omnipaque contrast was injected through the ureteroscope and there was a dilated ureter without evidence for further filling defects.  The ureteroscope was then removed and the wire was back loaded on the cystoscope.  A 6 x 24 double-J ureteral stent with a string tether was then placed over the wire using Seldinger technique and positioned appropriately under fluoroscopic and cystoscopic guidance.  The wire was removed with a good curl noted in the renal pelvis as well as in the bladder.  The patient's bladder was emptied.  He tolerated the procedure well without complications.  These stones will be brought to [facility] for stone analysis.  He was able to be  awakened and transferred to recovery unit in satisfactory condition.     Heloise Purpura, MD     LB/MEDQ  D:  07/12/2010  T:  07/13/2010  Job:  [id number]  Electronically Signed by Heloise Purpura MD on 07/23/2010 10:21:16 PM

## 2010-07-28 NOTE — H&P (Signed)
NAME:  [name], [name]               ACCOUNT NO.:  [id number]  MEDICAL RECORD NO.:  [id number]           PATIENT TYPE:  E  LOCATION:  [facility]                         FACILITY:  [facility]  PHYSICIAN:  Eduard Clos, MDDATE OF BIRTH:  [birth year]  DATE OF ADMISSION:  07/10/2010 DATE OF DISCHARGE:                             HISTORY & PHYSICAL   PRIMARY CARE PHYSICIAN:  At this time is unassigned.  Used to be Dr. Marga Melnick, the patient has not gone to Dr. Alwyn Ren for the last 2 years.  CHIEF COMPLAINT:  Abdominal pain.  HISTORY OF PRESENT ILLNESS:  A 50 year old male with history of congenital deafness.  At this time, I have used sign Presenter, broadcasting.  The patient complains of sudden onset abdominal pain last morning, at 11 o'clock, which was more in the right flank area radiating into the groin, which was like stabbing in nature, constant, persistent. The patient came to the ER.  In the ER, the patient had a CT of abdomen and pelvis at this time which shows a couple of stones which have already passed and there is one in the ureter at this time.  The patient has got some pain relief medication and his pain has improved a lot. During the stay in the ER, the patient had a bowel movement, which was bloody.  He said he moved a lot of blood.  He does get bloody bowel movement off and on for the last few months.  He feels he maybe having hemorrhoids.  At this time, he does not have any nausea or vomiting. Did feel nauseated earlier.  Denies any chest pain, shortness of breath, palpitations, dizziness, loss of consciousness, or any dysuria.  When he came in, his urine was clear.  At this time, it is getting more and more darker.  The patient has been admitted for further management of his abdominal pain and possible Urology consult later in the day.  PAST MEDICAL HISTORY:  History of congenital deafness and previous history of renal stones at age 73 and event he had it  extracted.  PAST SURGICAL HISTORY:  Tonsillectomy and right hand surgery and ureteral stone extraction at age 32, and cholecystectomy.  MEDICATIONS ON ADMISSION:  He did try some Aleve today.  SOCIAL HISTORY:  The patient smokes cigarettes, has been advised to quit smoking.  Denies alcohol at this time.  He said he used to be a heavy drinker.  He quit in 1995 through [employer] Anonymous and does not want to drink again, uses marijuana.  ALLERGIES:  VICODIN.  FAMILY HISTORY:  Significant for rectal cancer in his mom.  REVIEW OF SYSTEMS:  As per history of present illness, nothing else significant.  PHYSICAL EXAMINATION:  GENERAL:  The patient examined at bedside, not in acute distress. VITAL SIGNS:  Blood pressure is 135/86, pulse is 71 per minute, temperature 98, respirations 18 per minute, and O2 sats 98%. HEENT:  Anicteric.  No pallor.  No discharge from ears, eyes, nose, or mouth. CHEST:  Bilateral air entry present.  No rhonchi, no crepitation. HEART:  S1 and S2 heard. ABDOMEN:  Soft, mild  tenderness in the right flank area.  No guarding or rigidity. CNS:  Alert, awake, and oriented to time, place, and person.  Moves upper and lower extremities 5/5. EXTREMITIES:  Peripheral pulses felt.  No edema.  LABORATORY DATA:  CBC; WBC 16.7, hemoglobin is 15, hematocrit is 42.2, platelets 268, neutrophils 80%.  Complete metabolic panel; sodium 137, potassium 4, chloride 103, carbon dioxide 26, glucose 95, BUN 19, creatinine 1.5, total bilirubin is 0.6, alk phos 97, AST 20, ALT 13, total protein 7.7 and 4.3, calcium 9.7.  UA is negative.  CT abdomen and pelvis with contrast media shows mild right-sided hydronephrosis noted with significant perinephric stranding and fluids.  There is a recently passed 5-mm stone at the base of bladder as well as a 3-mm stone in the bladder that was passed earlier and a 1-mm stone still at the right vesicoureteral junction.  There is also a 5-mm  obstructing stone in the right ureteropelvic junction at the level of the kidney.  Large irregular 1.4 cm in the lower pole of the right kidney, smaller right renal stones also noted, focal renal scarring, and atrophy involving the anterior lobe of the left kidney, mild degenerative change noted along the lower lumbar spine.  ASSESSMENT: 1. Abdominal pain from right-sided ureteric stone. 2. Rectal bleeding. 3. History of deafness.  PLAN: 1. At this time, admit the patient to the medical floor. 2. For his right ureteral stone, at this time the patient will be     gently hydrated.  The patient's urine is getting more darker at     this time.  We will get an urine culture.  The patient does have     some leukocytosis with some right-sided perinephric stranding, I am     going to start empirically on Cipro.  The patient is kept n.p.o. in     anticipation of any urological procedure. 3. Rectal bleeding.  At this time, we will repeat CBC stat, type and     screen 2 units and hold.  The patient does have history of rectal     cancer.  The patient definitely needs GI consult, which can be done     as an outpatient, but if there is significant fall in his     hemoglobin, then may need inpatient consult. 4. Further recommendation as condition evolves.     Eduard Clos, MD     ANK/MEDQ  D:  07/11/2010  T:  07/11/2010  Job:  [id number]  Electronically Signed by Midge Minium MD on 07/28/2010 07:54:48 AM

## 2010-08-23 NOTE — Consult Note (Signed)
[name], [name]               ACCOUNT NO.:  [id number]  MEDICAL RECORD NO.:  [id number]           PATIENT TYPE:  I  LOCATION:  4503                         FACILITY:  [facility]  PHYSICIAN:  Danae Chen, M.D.  DATE OF BIRTH:  1960/05/18  DATE OF CONSULTATION:  07/11/2010 DATE OF DISCHARGE:  07/11/2010                                CONSULTATION   REASON FOR CONSULTATION:  Right flank pain and kidney stone.  HISTORY OF PRESENT ILLNESS:  The patient is a 50 year old male who is deaf.  He was seen in the emergency room with a history of sudden onset of severe right flank pain radiating to the right lower quadrant and the right groin starting in the morning of July 10, 2010.  He went to the emergency room and had a CT scan that showed several stones in the bladder, one of which measures 5-mm, a 14-mm stone in the lower pole of the left kidney as well as a smaller stones in the lower pole of the kidney and a 5-mm stone at the right UPJ.  The patient was treated with analgesics and when I saw him, he was comfortable.  He did not have any pain.  He has a past history of kidney stone and had stone extraction about 20 or 25 years ago.  He denies frequency, urgency, hesitancy or straining on urination.  PAST MEDICAL HISTORY:  Positive for congenital deafness and history of kidney stone.  PAST SURGICAL HISTORY:  He had tonsillectomy, knee surgery and cholecystectomy.  MEDICATIONS:  He is on no medications.  ALLERGIES:  He is allergic to VICODIN.  FAMILY HISTORY:  Positive for rectal cancer in his mother.  SOCIAL HISTORY:  He has smoked about half to one pack a day for several years.  He does not drink.  He quit drinking in 1995.  REVIEW OF SYSTEMS:  As noted per HPI and he also has a history of rectal bleeding and everything else is negative.  PHYSICAL EXAMINATION:  GENERAL:  This is a well-developed 50 year old male who is in no acute distress at this time.  He is alert and  oriented to time, place and person.  As previously stated, he is deaf.  I communicated with him by writing. VITAL SIGNS:  His blood pressure is 115/68, pulse 63, respirations 20, temperature 98.7. HEENT:  His head is normal.  He has pink conjunctivae.  Ears, nose and throat are within normal limits. NECK:  Supple.  No cervical adenopathy.  No thyromegaly. CHEST:  Symmetrical. LUNGS:  Clear.  There is no labored breathing. HEART:  Regular rhythm. ABDOMEN:  Soft.  He has mild tenderness in the right flank and mild right CVA tenderness.  Kidneys are not palpable.  He has no hepatomegaly, no splenomegaly.  Bladder is not distended.  He has no inguinal hernia.  No inguinal adenopathy. GENITALIA:  Penis and scrotal contents are within normal limits and there is no testicular mass.  Cords and epididymis are within normal limits. EXTREMITIES:  Within normal limits. RECTAL:  Deferred.  LABORATORY DATA:  I independently reviewed the CT scan and the findings  are as noted above.  His hemoglobin is 14, hematocrit 39.2, WBC 11.4 and it is down from 16.7.  BUN is 18, creatinine 1.59.  Sodium 138, potassium 3.8.  Urinalysis shows no RBCs or WBCs.  IMPRESSION:  Bladder calculi, right ureteropelvic junction stone and right renal calculi.  SUGGESTION:  Since then, the patient has passed a stone that measures 5- mm and that stone is in the bladder, he maybe able to pass the right UPJ stone.  Since he is asymptomatic we can treat him conservatively at this time.  If he starts having severe pain, he would need cystoscopy and insertion of double-J stent with possible stone extraction.  If he remains asymptomatic, he can be discharged home.  He will be followed as an outpatient.  If and after he passes the right UPJ stone, we will then discuss with him regarding management of renal calculi with either PCNL or ESL.  Hopefully, he will pass the ureteral stone and would not need intervention.  However, if  he starts having severe pain, he would need stone manipulation or insertion of double-J stent.     Danae Chen, M.D.     MN/MEDQ  D:  07/11/2010  T:  07/12/2010  Job:  [id number]  Electronically Signed by Lindaann Slough M.D. on 08/23/2010 10:26:45 AM

## 2010-09-09 NOTE — Op Note (Signed)
[name], [name]               ACCOUNT NO.:  [id number]   MEDICAL RECORD NO.:  [id number]          PATIENT TYPE:  AMB   LOCATION:  [facility]                          FACILITY:  [facility]   PHYSICIAN:  Matthew A. 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   POSTOPERATIVE DIAGNOSIS:  Right fifth CMC nonunion status post attempted  fusion.   PROCEDURE:  Takedown nonunion right fifth CMC joint, removal of a 25 mm  standard Accu-Chek screw with Vitoss bone grafting with iliac crest  aspirate and placement of a 26 mm Accu-Chek #2 standard screw.   SURGEON:  Artist Pais. Mina Marble, M.D.   ASSISTANT:  None.   ANESTHESIA:  General.   TOURNIQUET TIME:  One hour and 10 minutes.   COMPLICATIONS:  None.   DRAINS:  None.   PROCEDURE IN DETAIL:  The patient was taken to the operating suite and  after the induction of adequate general anesthesia the right upper  extremity was prepped and draped in the usual sterile fashion.  An  Esmarch was used to exsanguinate the limb and the tourniquet was  inflated to 250 mmHg at this point in time.  Using his previous incision  the skin was incised dorsally over the [facility] joint fifth finger.  Base of  the metacarpal was identified.  The distal aspect of the hamate was  identified.  The nonunion site was identified and debrided using a  rongeurs down to good cancellous bone.  The previously placed standard  Accu-Chek screw 25 mm in length was removed through a separate incision  on the lateral aspect of the fifth metacarpal base and once this was  done an iliac crest aspirate was undertaken using standard technique.  This was mixed with a Vitoss graft, Vitoss graft/iliac crest aspirate  mixture was then packed in the fusion site.  At this point in time using  the  previously placed screw hole for the standard Accu-Chek screw a 26  mm standard Accu-Chek #2 screw was then placed into the previous hole  under direct fluoroscopic guidance.  Intraoperative fluoroscopy revealed  good reduction in both the AP and lateral plane, good placement of the  hardware.  Remaining aspects of the fusion site were then packed with  the remaining aspects of the  Vitoss graft.  The capsule elements were closed with 3-0 Vicryl and the  skin incisions with 4-0 Vicryl Rapide.  Xeroform, 4x4s, fluffs and ulnar  gutter splint was applied to the hand and a sterile dressing to the  iliac crest aspirate site.  The patient tolerated both procedures well  and went to the recovery room in stable fashion.      Artist Pais Mina Marble, M.D.  Electronically Signed     MAW/MEDQ  D:  03/30/2007  T:  03/30/2007  Job:  [id number]

## 2010-09-09 NOTE — Op Note (Signed)
NAMETREA, LATNER               ACCOUNT NO.:  [id number]   MEDICAL RECORD NO.:  [id number]          PATIENT TYPE:  AMB   LOCATION:  DSC                          FACILITY:  [facility]   PHYSICIAN:  Matthew A. Weingold, M.D.DATE OF BIRTH:  [birth year]   DATE OF PROCEDURE:  DATE OF DISCHARGE:                               OPERATIVE REPORT   PREOPERATIVE DIAGNOSIS:  Nonunion, right fifth fusion status post K-wire  placement and bone grafting.   POSTOPERATIVE DIAGNOSIS:  Nonunion, right fifth fusion status post K-  wire placement and bone grafting.   PROCEDURE:  Removal of K-wires and placement of 25 mm standard Accu-Chek  screw to reduce carpometacarpal joint fusion right fifth carpometacarpal  joint.   SURGEON:  Artist Pais. Mina Marble, M.D.   ASSISTANT:  None.   ANESTHESIA:  General.   TOURNIQUET TIME:  30 minutes.   COMPLICATIONS:  None.   DRAINS:  None.   OPERATIVE REPORT:  The patient was taken to operating suite.  After  induction of adequate general anesthesia, left upper extremity is  prepped and draped in sterile fashion.  Esmarch was used to exsanguinate  the limb, tourniquet inflated to 250 mmHg.  At this point in time  incision was made over the ulnar border of the right hand over the  palpable tips of two previously placed 0.045 K-wires dissection was  carried down through the skin and subcutaneous tissues just outside the  hypothenar muscle fascial layer the pins were identified.  Intraoperative fluoroscopy revealed the radial most pin which was to be  used later for Accutrac screw placement.  Once this was done, the ulnar  side pin was removed and Accutrac tap was then used in a cannulated  fashion over the radial-sided K-wire under fluoroscopic and direct  guidance.  It was driven across the [facility] fusion site.  It was removed.  The depth gauge was then used to measure 25 mm standard Accutrac screw.  This was then carefully placed across the fusion site over the  previously mentioned K-wire.  The K-wire was then removed.  Intraoperative fluoroscopy revealed good placement of the AP lateral  oblique view.  The wound was then thoroughly irrigated and closed with 4-  0 nylon.  Xeroform, 4x4s and a compression wrap was applied.  The  patient tolerated well and went to recovery in stable fashion.     Artist Pais Mina Marble, M.D.  Electronically Signed    MAW/MEDQ  D:  09/29/2006  T:  09/29/2006  Job:  [id number]

## 2010-09-12 NOTE — Op Note (Signed)
NAME:  [name], [name]               ACCOUNT NO.:  [id number]   MEDICAL RECORD NO.:  [id number]          PATIENT TYPE:  INP   LOCATION:  1824                         FACILITY:  [facility]   PHYSICIAN:  Sandria Bales. Ezzard Standing, M.D.  DATE OF BIRTH:  18-May-1960   DATE OF PROCEDURE:  10/20/2005  DATE OF DISCHARGE:                                 OPERATIVE REPORT   PREOPERATIVE DIAGNOSIS:  Cholecystitis, cholelithiasis.   POSTOPERATIVE DIAGNOSES:  Cholecystitis, cholelithiasis.   PROCEDURE:  Laparoscopic cholecystectomy with intra-operative cholangiogram.   SURGEON:  Sandria Bales. Ezzard Standing, M.D.   FIRST ASSISTANT:  Alfonse Ras, M.D.   ANESTHESIA:  General endotracheal.   ESTIMATED BLOOD LOSS:  Minimal.   INDICATION FOR PROCEDURE:  [name] is a 50 year old white male patient of  Dr. Lona Kettle, who is deaf, who comes with abdominal pain through the  [facility] ER with about one week's origin, which has gotten worse.  He has  an elevated white blood count, right upper quadrant pain, and an ultrasound  consistent with probable cholecystitis.   The patient had an interpreter at the bedside.  I went over with him the  indications and potential complications of surgery.  Potential complications  include but are not limited to bleeding, infection, bile duct injury, the  possibility of open surgery.   OPERATIVE NOTE:  The patient placed in supine position and given a general  endotracheal anesthetic.  He already had 1 g of Unasyn in the emergency  room.   His abdomen was prepped with Betadine solution and sterile draped.   An infraumbilical incision was made with sharp dissection and carried down  to the abdominal cavity.  A 0 degree 10 mm laparoscope was inserted in  through a 12 mm Hasson trocar and the Hasson trocar was secured with a 0  Vicryl suture.  A 10 mm trocar was placed in the subxiphoid location and a 5  mm trocar in the right midsubcostal and a 5 mm in the right lateral  subcostal  location, the gallbladder identified and rotated cephalad.  The  gallbladder had some thickened gallbladder wall and some edema consistent  with cholecystitis.  Dissection was carried out along the gallbladder-cystic  duct junction.  The patient had a small cystic duct, and this was dissected  free.  An intraoperative cholangiogram was then obtained.   The intraoperative cholangiogram was obtained using a cut-off Taut catheter  inserted through a 14-gauge Jelco catheter and inserted to the common bile  and cystic duct and secured with an Endoclip.   Half-strength Hypaque solution about 8 mL was then injected under  fluoroscopy, showing free flow down the cystic duct, into the common bile  duct, into the duodenum and refluxing up the hepatic radicles.  The patient  was noted to have a very small cystic duct-ampullary tree system.  The Taut  catheter was then removed, the cystic duct triply endoclipped and divided.  The cystic artery behind the cystic duct was also identified, triply  endoclipped and divided.   The gallbladder was then sharply and bluntly dissected from the gallbladder  bed.  Prior to complete division of the gallbladder from the gallbladder  bed, the triangle of Calot and the gallbladder bed were then revisualized.  There was no bile leak and no bleeding.  The gallbladder was then placed in  an EndoCatch bag and delivered through the umbilicus.   The abdomen was irrigated with about 500 mL of saline.  The trocars were  then removed in turn.  There was no bleeding at any trocar site.  The  umbilical port was closed with a 0 Vicryl suture.  The skin at each site was  closed with 5-0 Vicryl suture, painted with tincture of Benzoin and steri-  stripped.   The patient tolerated the procedure well and was transported to the recovery  room in good condition.  Sponge and needle count were correct at the end of  the case.      Sandria Bales. Ezzard Standing, M.D.  Electronically  Signed     DHN/MEDQ  D:  10/20/2005  T:  10/21/2005  Job:  [id number]   cc:   Titus Dubin. Alwyn Ren, M.D. [facility]  [phone] [place]  [place] [address]

## 2010-09-12 NOTE — Consult Note (Signed)
[name], [name]               ACCOUNT NO.:  [id number]   MEDICAL RECORD NO.:  [id number]          PATIENT TYPE:  EMS   LOCATION:  ED                           FACILITY:  [facility]   PHYSICIAN:  Marcelo Baldy, DDS, MDDATE OF BIRTH:  06-Aug-1960   DATE OF CONSULTATION:  08/25/2006  DATE OF DISCHARGE:                                 CONSULTATION   CHIEF COMPLAINT:  Lip pain.   HISTORY OF PRESENT ILLNESS:  The patient is a 50 year old male who  sustained a dog bite to his upper and lower lip and presented to the  emergency room shortly thereafter.   PAST MEDICAL HISTORY:  The patient has congenital deafness.   PAST SURGICAL HISTORY:  Right wrist surgery.   MEDICATIONS:  Vicodin.   ALLERGIES:  No known drug allergies.   SOCIAL HISTORY:  Significant for tobacco.  He smokes approximately 1-1/2  packs of cigarettes per day.  He does not drink and does not use any  drugs.   PHYSICAL EXAMINATION:  The patient is awake, alert and oriented x3.  We  had an [employer] interpreter present for the entire  consultation and procedure.  The patient has an approximately 3 cm  avulsion of his lower lip which includes the mucosa and a portion of the  muscle of his left lower lip.  He also has a 1-1/2 cm vertical lip  laceration of the upper lip going from the skin through the vermilion to  the wet line.  He also has a puncture wound of the right upper lip.  He  has no other signs of trauma in his facial area.  His heart had a  regular rate and rhythm.  His lungs were clear to auscultation  bilaterally.  His abdomen was soft, nontender, and nondistended and had  good bowel sounds.  He has a splint on his right upper extremity.   The plan for him was to do a local rotation advancement flap to cover  the area of avulsed tissue of the left lower lip and primary closure of  the upper lip laceration.  After discussing the risks, benefits, and  alternatives to the procedure, the  patient willing gave verbal and  written consent for the procedure.  It was explained to him in detail  prior to the procedure that it was imperative that he does not smoke  after the procedure due to having lift flaps to close the defect.  He  understood this and willingly consented to the procedure and assured me  that he would not smoke during the time of healing.   After consent was obtained and intravenous anesthesia was induced,  approximately 6 mL of 1% lidocaine with 1:100,000 epinephrine was  infiltrated about the lower lip down to the depth of the vestibule on  the mucosal side and along the vermilion to the commissure on the skin  side.  Approximately 2 mL of 1% lidocaine with 1:100,000 epinephrine was  infiltrated along the upper lip in the area of the laceration as well.  The patient's face and mouth were prepared with  Betadine and both of the  laceration and avulsion sites were irrigated copiously with normal  saline.  After the copious irrigation, hemostasis was obtained in the  upper lip laceration with electrocautery.  The upper lip laceration was  then closed in layers using 4-0 Vicryl in the muscle layer and 5-0  Prolene in the skin and 3-0 chromic gut suture in the vermilion.  This  was all done in an interrupted fashion.   Attention was then turned to the lower lip.  An incision was made along  the vermilion extending from the edge of the avulsion to approximately  the commissure.  A mucosal flap was then elevated from the vermilion to  the depth of the vestibule and a vertical release incision was made from  the medial aspect of the avulsion into the depth of the vestibule.  After the mucosal flap was elevated and mobilized adequately, hemostasis  was obtained with electrocautery.  The flap was then advanced and  rotated to close and cover the avulsion defect and recreating a new  vermilion border.  The vermilion border was reapproximated using 5-0  Prolene suture  in an interrupted fashion.  The mucosa of the vestibule  was closed using 3-0 chromic gut suture and the mucosa of the vermilion  was closed also using 5-0 Prolene suture.   After the procedure was completed, the flap that was elevated had good  capillary refill.  It did demonstrate a slight volume deficit,  especially toward the midline where a larger portion of the muscle of  the lip had been avulsed.  The patient received a dose of Unasyn prior  to the procedure and was sent home with a prescription for Augmentin 875  mg p.o. b.i.d. for 10 days.  His postoperative instructions including  keeping the area clean and moistened with ointment.  He is also  instructed to rinse his mouth with warm salt water several times a day.  He is to be on a soft diet and limit his mouth opening for the next  several days until initial healing occurs.  He also has strict  instructions to not smoke.  He understood this as well as his girlfriend  understood this.  He was also given a prescription for Percocet 5/325  one tablet p.o. q.4-6h. p.r.n. pain.  He was instructed to call my  office in the morning for follow-up appointment for next Tuesday and  then follow-up appointment next Thursday for suture removal.  He is to  call [phone].  He was also instructed to call the office in the  meantime for any increased pain or swelling, discoloration of the flaps  or fever or any other problems.  The patient was awakened from his  anesthesia without difficulty, having tolerated the procedure well.  When deemed stable by the emergency room for discharge, was discharged  to his home with the above prescriptions and instructions.      Marcelo Baldy, DDS, MD  Electronically Signed     TGB/MEDQ  D:  08/26/2006  T:  08/27/2006  Job:  [id number]

## 2010-09-12 NOTE — H&P (Signed)
NAMEDEIGO, ALONSO               ACCOUNT NO.:  [id number]   MEDICAL RECORD NO.:  [id number]          PATIENT TYPE:  INP   LOCATION:  1824                         FACILITY:  [facility]   PHYSICIAN:  Wilmon Arms. Corliss Skains, M.D. DATE OF BIRTH:  05-Sep-1960   DATE OF ADMISSION:  10/20/2005  DATE OF DISCHARGE:                                HISTORY & PHYSICAL   PRIMARY CARE PHYSICIAN:  Dr. Tamala Fothergill, last seen about 2 years prior.   CHIEF COMPLAINT:  Right upper quadrant abdominal pain with anorexia and  nausea since last week.   HISTORY OF PRESENT ILLNESS:  Mr. Eastridge is a 50 year old man, no significant  past history.  He has had diverticulosis and tobacco abuse.  About last  Wednesday, he began with mild upper abdominal pain, no particular  aggravating or ameliorating symptoms.  By this past Friday, the pain had  increased severely and he was unable to sleep.  Since that time, he has had  severe anorexia.  He is tolerating water but he has had nausea with no  emesis.  He has really been unable to eat much food at all.  In the past 48  hours, he has had multiple diarrheal stools.  He finally presented to the ER  because of his symptoms today.  His white count was 15,500.  LFTs are  normal.  A CT of the abdomen and pelvis was done and this revealed  gallbladder wall thickening and a large nearly 2-cm stone within the  gallbladder itself and radiologist is concerned for acute cholecystitis;  therefore, surgical consultation has been obtained.   REVIEW OF SYSTEMS:  As per the history of present illness.  Again, the  patient has had a poor appetite.  He tried to eat this morning, but he could  not.  He spit the food out.  He last drank some water about 10 a.m.  He has  had chills, no fevers.   PAST MEDICAL HISTORY:  1.  Diverticulosis.  2.  Renal calculi.   PAST SURGICAL HISTORY:  1.  Tonsillectomy as a child.  2.  Cystoscopy for stone retrieval.   FAMILY HISTORY:   Noncontributory.   SOCIAL HISTORY:  He smokes 1 and 1/2 packs of cigarettes per day.  He has  rare alcohol.  He does smoke marijuana occasionally.  He is deaf and a sign  Presenter, broadcasting was utilized during the history and physical exam.   DRUG ALLERGIES:  NKDA.   CURRENT MEDICATIONS:  None.  He was taking some Excedrin at home for pain.   PHYSICAL EXAMINATION:  GENERAL:  A pleasant male patient who has received  antiemetic and pain medication in the ER who was now reporting significant  decrease in right upper quadrant abdominal pain.  VITAL SIGNS:  Temp 98.2, BP 110/70, pulse 50 and regular, respirations 20.  HEENT:  Head is normocephalic.  Sclerae not injected.  NECK:  Supple.  No adenopathy.  CHEST:  Bilateral lung sound are clear to auscultation.  Respiratory effort  is not labored.  CARDIAC:  S1 and S2.  No rubs, murmurs,  thrills, no gallops.  Pulse is  bradycardic rate but regular sinus.  No JVD.  ABDOMEN:  Soft.  Exquisitely tender over the right upper quadrant.  There is  a positive Murphy sign.  There is a firm nodule area palpated in the right  upper quadrant just under the rib cage that I suspect correlates with the  large stone seen on CT.  Bowel sounds are present.  EXTREMITIES:  Symmetrical in appearance without edema, cyanosis, or  clubbing.  SKIN:  There are no scars on the abdomen area consistent with any prior  abdominal surgery.  The patient does have a tattoo on the left biceps.  NEUROLOGIC:  The patient is alert and oriented x3, moving all extremities  x4.  Cranial nerves II-XII are intact.  Other than cranial nerve VIII.  Patient with congenital deafness.   LABORATORY:  Sodium 136, potassium 3.5, CO2 24, BUN 8, creatinine 0.9,  glucose 112.  WBC 15,500, hemoglobin 13.4, platelets 252,000, neutrophils  78%.  Urinalysis is negative.  Point-of-care enzymes x1 have been negative.  CT of the abdomen and pelvis as noted.   IMPRESSION:  1.  Cholelithiasis,  large stone within the gallbladder.  2.  Acute cholecystitis.  3.  Leukocytosis.  4.  Tobacco abuse.   PLAN:  1.  NPO.  2.  IV fluid hydration.  3.  Unasyn IV for acute cholecystitis.  4.  Dilaudid for pain.  5.  Phenergan for nausea.  6.  Ativan and nicotine patch to assist with nicotine withdrawal.  7.  Plan OR today, laparoscopic cholecystectomy.  Risks and benefits of the      procedure have been explained to the patient via the sign language      translator.  The procedure itself has also been explained to the patient      via the sign language translator.  Risks include risk of general      anesthesia, bleeding, injury to internal organs such as bowel during the      procedure and/or wound infection.  The patient verbalizes understanding      and wishes to proceed.      Allison L. Rondel Jumbo. Tsuei, M.D.  Electronically Signed    ALE/MEDQ  D:  10/20/2005  T:  10/20/2005  Job:  [id number]   cc:   Tamala Fothergill, Dr.

## 2010-09-12 NOTE — Consult Note (Signed)
NAMEBRETTON, [name] NO.:  [id number]   MEDICAL RECORD NO.:  [id number]                   PATIENT TYPE:  EMS   LOCATION:  ED                                   FACILITY:  [facility]   PHYSICIAN:  Titus Dubin. Alwyn Ren, M.D. [facility]         DATE OF BIRTH:  08/07/1960   DATE OF CONSULTATION:  02/08/2003  DATE OF DISCHARGE:                                   CONSULTATION   REASON FOR CONSULTATION:  [name] is a private patient of Dr.  Loreen Freud who comes to the emergency room with chest pain.   His chest pain actually began approximately four days ago and has been  intermittent. He is unable to quantitate the pain but describes it as  substernal with radiation to both upper extremities.   He was initially seen October 11 in the office for possible cellulitis of  the right upper extremity related to retained glass fragments. At that time,  he was found to be hypertensive and metoprolol 50 mg daily was prescribed.   Although he is deaf, he was able to communicate by written questions and  answers on a pad. He had multiple concerns and complaints on that visit. He  mentioned chest pain, headache, abdominal pain.   EKG was nondiagnostic and clinically there was no evidence of any acute  decompensation. He was given metoprolol 50 mg for the hypertension and told  to go to the emergency room should he have progressive recurrent chest pain.  It is because of that admonition that he came into today although the chest  pain has been intermittent and has not changed. Significant in the emergency  room, he received nitroglycerin with a significant improvement in his pain.   PAST MEDICAL HISTORY:  1. Renal calculi.  2. Cholecystectomy.  3. Knee surgery.  4. Tonsillectomy and adenoidectomy.  5. Thumb surgery.   ALLERGIES:  He has no known allergies.   MEDICATIONS:  Metoprolol.   SOCIAL HISTORY:  He smokes a pack per day and drinks occasionally.   FAMILY  HISTORY:  Positive for coronary artery disease in paternal  grandfather and maternal grandfather. His paternal grandfather had a  myocardial infarction, maternal grandfather had a stroke. Mother had cancer  of the rectum with liver mets.   REVIEW OF SYMPTOMS:  Positive for left sided headache which has been  nonspecific. He is on no medications for them. He also has lower abdominal  pain which is cramping. Bowel movements do seem to be of benefit.   PHYSICAL EXAMINATION:  GENERAL:  He is in acute distress.  VITAL SIGNS:  Blood pressure is 128/87, pulse 97, respiratory rate 20, O2  saturations 99% on room air.  HEENT:  Pupils are equally round and reactive. Fundi exam is unremarkable.  Otolaryngologic exam is unremarkable. Thyroid is normal to palpation.  CHEST:  Clear. An S4 was noted.  ABDOMEN:  He has  no organomegaly or lymphadenopathy. Abdomen is nontender.  Bowel sounds are normal. Homan sign is negative.  EXTREMITIES:  Dorsalis pedis pulses appear to be slightly decreased. There  is no cyanosis or jaundice.   EKG reveals nonspecific ST-T wave changes in V1 to V3. The T waves inverted  in lead 1. There is full R wave progression in V1 and V2.   Hematocrit is 41.4, CPK was 137 which is within normal limits as was the MB  at 2.5 and the index at 1.8. Troponin is 0.01 which is also normal.   It was recommended that he be admitted for telemetric monitoring with serial  cardiac enzymes. If these were negative and there was no change in his EKG,  a stress Cardiolite was planned.   He adamantly stated that he would not come into the hospital. His main  concern was his fear of hospitals related to his mother's multiple  hospitalizations. His response was verified by Dr. Margretta Ditty, and AMA form  was requested to be signed confirming that he understands the risk involved  in leaving the hospital. The communication was conducted through an  interpreter hired by the hospital.   Attempts  will be made to set up a stress Cardiolite as an outpatient and  contact me with the interpreting service to be there to facilitate  completion of the study.                                               Titus Dubin. Alwyn Ren, M.D. [facility]    [facility]/MEDQ  D:  02/08/2003  T:  02/08/2003  Job:  [id number]

## 2010-12-24 ENCOUNTER — Telehealth: Payer: Self-pay | Admitting: *Deleted

## 2010-12-24 NOTE — Telephone Encounter (Signed)
Spoke w/patient VIA relay, he is req RX for chantix to help stop smoking. Advised OV to discuss (last & first OV was 11/2009). Scheduled for 30 min apt next week, Latoya called and set up interpreter.

## 2011-01-01 ENCOUNTER — Ambulatory Visit: Payer: Medicare Other | Admitting: Internal Medicine

## 2011-01-07 ENCOUNTER — Ambulatory Visit: Payer: Medicare Other | Admitting: Internal Medicine

## 2011-01-23 LAB — COMPREHENSIVE METABOLIC PANEL
ALT: 46
AST: 93 — ABNORMAL HIGH
Albumin: 3.5
Alkaline Phosphatase: 101
BUN: 10
Chloride: 104
Potassium: 3.2 — ABNORMAL LOW
Sodium: 135
Total Bilirubin: 1.2
Total Protein: 5.8 — ABNORMAL LOW

## 2011-01-23 LAB — DIFFERENTIAL
Basophils Absolute: 0.1
Basophils Relative: 0
Eosinophils Absolute: 0.2
Eosinophils Relative: 1
Monocytes Absolute: 0.7
Monocytes Relative: 5
Neutro Abs: 9.8 — ABNORMAL HIGH

## 2011-01-23 LAB — CBC
HCT: 41.2
Platelets: 184
RDW: 12.8
WBC: 13.2 — ABNORMAL HIGH

## 2011-02-02 LAB — POCT HEMOGLOBIN-HEMACUE
Hemoglobin: 16.4
Operator id: 128471

## 2011-02-12 LAB — POCT HEMOGLOBIN-HEMACUE
Hemoglobin: 15
Operator id: 116011

## 2011-07-23 ENCOUNTER — Encounter (HOSPITAL_COMMUNITY): Payer: Self-pay

## 2011-07-23 ENCOUNTER — Emergency Department (HOSPITAL_COMMUNITY)
Admission: EM | Admit: 2011-07-23 | Discharge: 2011-07-23 | Disposition: A | Discharge status: Home / Self Care | Payer: Medicare Other | Attending: Emergency Medicine | Admitting: Emergency Medicine

## 2011-07-23 DIAGNOSIS — T300 Burn of unspecified body region, unspecified degree: Secondary | ICD-10-CM

## 2011-07-23 DIAGNOSIS — M4802 Spinal stenosis, cervical region: Secondary | ICD-10-CM | POA: Insufficient documentation

## 2011-07-23 DIAGNOSIS — T310 Burns involving less than 10% of body surface: Secondary | ICD-10-CM | POA: Insufficient documentation

## 2011-07-23 DIAGNOSIS — X12XXXA Contact with other hot fluids, initial encounter: Secondary | ICD-10-CM | POA: Insufficient documentation

## 2011-07-23 DIAGNOSIS — Y93G1 Activity, food preparation and clean up: Secondary | ICD-10-CM | POA: Insufficient documentation

## 2011-07-23 DIAGNOSIS — R0981 Nasal congestion: Secondary | ICD-10-CM

## 2011-07-23 DIAGNOSIS — J3489 Other specified disorders of nose and nasal sinuses: Secondary | ICD-10-CM | POA: Insufficient documentation

## 2011-07-23 DIAGNOSIS — F17200 Nicotine dependence, unspecified, uncomplicated: Secondary | ICD-10-CM | POA: Insufficient documentation

## 2011-07-23 DIAGNOSIS — Y998 Other external cause status: Secondary | ICD-10-CM | POA: Insufficient documentation

## 2011-07-23 DIAGNOSIS — T23029A Burn of unspecified degree of unspecified single finger (nail) except thumb, initial encounter: Secondary | ICD-10-CM | POA: Insufficient documentation

## 2011-07-23 DIAGNOSIS — X131XXA Other contact with steam and other hot vapors, initial encounter: Secondary | ICD-10-CM | POA: Insufficient documentation

## 2011-07-23 MED ORDER — CROMOLYN SODIUM 5.2 MG/ACT NA AERS: 1.0000 | INHALATION_SPRAY | Freq: Four times a day (QID) | NASAL | Status: DC

## 2011-07-23 MED ORDER — SILVER SULFADIAZINE 1 % EX CREA: TOPICAL_CREAM | Freq: Every day | CUTANEOUS | Status: DC

## 2011-07-23 NOTE — ED Provider Notes (Signed)
History     CSN: [id number]  Arrival date & time 07/23/11  1419   First MD Initiated Contact with Patient 07/23/11 1510      No chief complaint on file.   (Consider location/radiation/quality/duration/timing/severity/associated sxs/prior treatment) HPI Comments: Patient reports sustained a grease burn to his right dorsal ring finger 6-8 days ago while making bacon.  States the grease popped out of the pan and hit his finger.  Patient states there was initially a blister which he popped and pulled off, and has since been applying "Tusk" burn/hemorrhoid cream to it and soaking it in peroxide.  States it has been doing very well and the pain is improving but today noticed that the base of it was yellow and became concerned that it might be infected.  Denies fevers, discharge from the wound.  Denies any other burns or injuries from the event.  Pt is not diabetic.    Patient also requests help with chronic nasal congestion x 3-4 years.  States he has it intermittently, has tried a neti pot and warm steam without improvement.  No known allergies.  Denies fevers, sinus pressure or pain.    The history is provided by the patient. A language interpreter was used.    Past Medical History  Diagnosis Date  . Osteoarthrosis, unspecified whether generalized or localized, shoulder region   . Spondylosis of unspecified site without mention of myelopathy   . Spinal stenosis in cervical region   . Brachial neuritis or radiculitis NOS   . Tobacco use disorder   . Other diseases of nasal cavity and sinuses   . History of nephrolithiasis     Past Surgical History  Procedure Date  . Knee surgery   . Hand surgery   . Cholecystectomy   . Tonsillectomy     Family History  Problem Relation Age of Onset  . Heart disease Maternal Grandfather   . Heart disease Paternal Grandfather     History  Substance Use Topics  . Smoking status: Current Everyday Smoker  . Smokeless tobacco: Not on file  .  Alcohol Use: Yes      Review of Systems  Constitutional: Negative for fever.  HENT: Positive for congestion. Negative for sore throat and sinus pressure.   Respiratory: Negative for cough and shortness of breath.   Gastrointestinal: Negative for abdominal pain.  Skin: Positive for wound.  All other systems reviewed and are negative.    Allergies  Hydrocodone-acetaminophen  Home Medications  No current outpatient prescriptions on file.  BP 128/80  Pulse 78  Temp(Src) 98 F (36.7 C) (Oral)  Resp 20  SpO2 97%  Physical Exam  Nursing note and vitals reviewed. Constitutional: He is oriented to person, place, and time. He appears well-developed and well-nourished.  HENT:  Head: Normocephalic and atraumatic.  Nose: Mucosal edema present. Right sinus exhibits no maxillary sinus tenderness and no frontal sinus tenderness. Left sinus exhibits no maxillary sinus tenderness and no frontal sinus tenderness.  Neck: Neck supple.  Cardiovascular: Normal rate and regular rhythm.   Pulmonary/Chest: Effort normal and breath sounds normal.  Musculoskeletal:       Hands: Neurological: He is alert and oriented to person, place, and time.    ED Course  Procedures (including critical care time)  Labs Reviewed - No data to display No results found.   1. Burn   2. Chronic nasal congestion       MDM  Afebrile patient with healing burn to right 4th finger, dorsally.  Patient has been soaking wound in hydrogen peroxide, which I suspect is what is causing the "yellow" discoloration at the base of the wound.  There is no evidence of infection.  Patient also with chronic rhinitis that is likely allergic.  Pt d/c home with wound care instructions, burn ointment prescription, nasal spray, and PCP follow up.  Patient verbalizes understanding and agrees with plan.          Dillard Cannon [place], [place] 07/23/11 1943

## 2011-07-23 NOTE — ED Notes (Signed)
Signing interpreter paged.

## 2011-07-23 NOTE — ED Notes (Signed)
Pt presents with grease burn to R 4th finger x 5-6 days ago.  Pt reports attempting to care for it at home, but reports infection.

## 2011-07-23 NOTE — Discharge Instructions (Signed)
Please read the information below.  Use the prescribed ointment daily and keep your wound covered.  Make sure to exercise your finger as it heals.  Return to the ER immediately if you develop redness, swelling, pus draining from the wound, or fevers greater than 100.4.  You may return to the ER at any time for worsening condition or any new symptoms that concern you.

## 2011-08-02 NOTE — ED Provider Notes (Signed)
Evaluation and management procedures were performed by the PA/NP/Resident Physician under my supervision/collaboration.   Lyly Canizales D Sourish Allender, MD 08/02/11 1549 

## 2011-08-03 ENCOUNTER — Telehealth: Payer: Self-pay | Admitting: Internal Medicine

## 2011-08-03 NOTE — Telephone Encounter (Signed)
yes

## 2011-08-03 NOTE — Telephone Encounter (Signed)
Unfortunately  my practice is now closed

## 2011-08-03 NOTE — Telephone Encounter (Signed)
Dr.Hopper please advise 

## 2011-08-04 NOTE — Telephone Encounter (Signed)
Left message for patient to return call. Reason for call (detalis not left in message), inform patient of Dr.Hopper's response

## 2011-09-09 ENCOUNTER — Ambulatory Visit: Payer: Medicare Other | Admitting: Family Medicine

## 2011-09-10 ENCOUNTER — Ambulatory Visit: Payer: Medicare Other | Admitting: Family Medicine

## 2011-09-28 ENCOUNTER — Telehealth: Payer: Self-pay | Admitting: Family Medicine

## 2011-09-28 NOTE — Telephone Encounter (Signed)
Pt has upcoming appt w/ Dr. Beverely Low and would like [place] to call in generic wellbutrin so he can quit smoking. I advised interpreter that he may need his OV first, but pt insisted I ask. Pt uses [facility] @ [address]

## 2011-09-28 NOTE — Telephone Encounter (Signed)
Called pt to advise instructions per MD Beverely Low, pt answering service noted pt to respond no problem, he understands.

## 2011-09-28 NOTE — Telephone Encounter (Signed)
Please advise prior note

## 2011-09-28 NOTE — Telephone Encounter (Signed)
Cannot prescribe meds for a pt I have never seen.  Will need to wait until his first visit or get them from his previous doctor while waiting to establish.

## 2011-10-28 ENCOUNTER — Ambulatory Visit: Payer: Medicare Other | Admitting: Family Medicine

## 2011-11-02 ENCOUNTER — Encounter: Payer: Self-pay | Admitting: Family Medicine

## 2011-11-02 ENCOUNTER — Ambulatory Visit: Payer: Medicare Other | Admitting: Family Medicine

## 2011-11-02 DIAGNOSIS — Z0289 Encounter for other administrative examinations: Secondary | ICD-10-CM

## 2011-11-06 ENCOUNTER — Telehealth: Payer: Self-pay | Admitting: Internal Medicine

## 2011-11-06 NOTE — Telephone Encounter (Signed)
Patient calling today, trying to schedule an appointment, and asking why we cancelled his 11/09/11 appointment.  I explained to patient due to the numerous times he has cancelled and not showed up for his appointments, he can no longer schedule with our office per Dr. Beverely Low.  Patient not happy, states "you guys are so mean", says please schedule him for an appt, he promises he will not cancel again.  I informed patient I do not have that authority.  Patient then states he is actually Dr. Frederik Pear patient, and wants to be scheduled to see him, because he trust him.  Unable to get through to patient that he may not schedule at our office.  Patient would like a call back from our manager.

## 2011-11-06 NOTE — Telephone Encounter (Signed)
If pt has seen Dr Alwyn Ren previously it is up to Dr Alwyn Ren.  He currently has a Wellsite geologist (Dr Yetta Barre at [place]).  After 4 no-shows/same day cancellations to even establish, I won't schedule again

## 2011-11-09 ENCOUNTER — Ambulatory Visit: Payer: Medicare Other | Admitting: Family Medicine

## 2011-11-12 NOTE — Telephone Encounter (Signed)
[name], please see me about this today.

## 2011-11-13 NOTE — Telephone Encounter (Signed)
As per request from site [facility] when patient called in regarding appt., being cancelled & unable to reschedule, thru an interrupter, he stated it was not fair that he did not have rides and he had to go to a job interview and it was not fair that he could not get rides and had to go to a job interview. I explained thru the interrupter the decision to reschedule is that of the Provider and based on explanation pf cancellations and the fact they were not cancelled as per protocol which states:  If you are unable to make your appointment, please call by 5:00 pm the day before to avoid a $50.00 fee He would be charge the no show fee and the provider was unable to fill the slot(s) we had held for him because we were not given timely notice, therefore Provider tabori has refused to reschedule with him.  Interrupter stated Explicit, Explicit and stated caller hung up. I apologized to the interrupter for having to be relay that kind of message and she was appreciative.    History of appts below: 1st appt. Made 4.9.13 for DOS 5.16.13 at 3pm *This appt., was cancelled on 5.15.13 @ 11:09am no reason noted when cancelled  2nd appt was resch from 1st appt  5.16.13, DOS 7.3.13 at 3pm, * was cancelled on 7.3.13 at 11:29AM - cancel note Because "Weather (per pt has to walk to bus stop, too rainy today)"  3rd appt was resch 7.3.13, for 7.8.13 2:30pm (which is not a normal time slot or PCP for [facility], but worked PT in), *was cancelled 7.8.13 at 1:15pm "Pt stated he had a job interview and could not come in" I actually rescheduled the appt but was instructed per notes by PCP/LPN PER DR TABORI NO SHOW FEE APPLIES WE DID NOT GET 24HR NOTICE - PATIENT CAN NOT RESCH.-KP TO [facility] - mailed a letter to inform pt. Of cancellation of rescheduled appt., and no further appt., could be scheduled at [facility] and patient would be charged $50.00 N/S fee

## 2011-12-22 NOTE — Telephone Encounter (Signed)
Patient discharged from practice by Dr. Beverely Low, per letter.

## 2011-12-23 ENCOUNTER — Encounter (HOSPITAL_COMMUNITY): Payer: Self-pay | Admitting: Emergency Medicine

## 2011-12-23 ENCOUNTER — Emergency Department (HOSPITAL_COMMUNITY)
Admission: EM | Admit: 2011-12-23 | Discharge: 2011-12-23 | Disposition: A | Discharge status: Home / Self Care | Payer: Medicare Other | Attending: Emergency Medicine | Admitting: Emergency Medicine

## 2011-12-23 DIAGNOSIS — M1990 Unspecified osteoarthritis, unspecified site: Secondary | ICD-10-CM | POA: Insufficient documentation

## 2011-12-23 DIAGNOSIS — F17200 Nicotine dependence, unspecified, uncomplicated: Secondary | ICD-10-CM | POA: Insufficient documentation

## 2011-12-23 DIAGNOSIS — M545 Low back pain, unspecified: Secondary | ICD-10-CM

## 2011-12-23 DIAGNOSIS — J329 Chronic sinusitis, unspecified: Secondary | ICD-10-CM

## 2011-12-23 DIAGNOSIS — H9190 Unspecified hearing loss, unspecified ear: Secondary | ICD-10-CM | POA: Insufficient documentation

## 2011-12-23 HISTORY — DX: Unspecified hearing loss, unspecified ear: H91.90

## 2011-12-23 MED ORDER — IBUPROFEN 600 MG PO TABS: 600.0000 mg | ORAL_TABLET | Freq: Four times a day (QID) | ORAL | Status: AC | PRN

## 2011-12-23 MED ORDER — OXYCODONE-ACETAMINOPHEN 7.5-325 MG PO TABS: 1.0000 | ORAL_TABLET | ORAL | Status: AC | PRN

## 2011-12-23 MED ORDER — AMOXICILLIN-POT CLAVULANATE 500-125 MG PO TABS: 1.0000 | ORAL_TABLET | Freq: Three times a day (TID) | ORAL | Status: AC

## 2011-12-23 MED ORDER — OXYCODONE-ACETAMINOPHEN 5-325 MG PO TABS
2.0000 | ORAL_TABLET | Freq: Once | ORAL | Status: AC
  Administered 2011-12-23: 2 via ORAL
  Filled 2011-12-23: qty 2

## 2011-12-23 NOTE — ED Notes (Signed)
Pt is deaf . He does read lips and  Signs. interpeter called fro

## 2011-12-23 NOTE — ED Notes (Signed)
Facial pain sinus pain h/a x 3 days running nose and sneezing  also has back pain that rads to both hips left worse than rt no injury

## 2011-12-23 NOTE — ED Notes (Signed)
Coughing has also sounded bad he thinks

## 2011-12-23 NOTE — ED Provider Notes (Signed)
History   This chart was scribed for Nelia Shi, MD by Sofie Rower. The patient was seen in room TR09C/TR09C and the patient's care was started at 2:27 PM     CSN: [id number]  Arrival date & time 12/23/11  1322   First MD Initiated Contact with Patient 12/23/11 1400      Chief Complaint  Patient presents with  . Facial Pain    (Consider location/radiation/quality/duration/timing/severity/associated sxs/prior treatment) Patient is a 51 y.o. male presenting with headaches and back pain. The history is provided by the patient. A language interpreter was used.  Headache  This is a new problem. The current episode started more than 2 days ago. The problem occurs constantly. The problem has been gradually worsening. The headache is associated with nothing. The pain is moderate. Pertinent negatives include no fever, no nausea and no vomiting. He has tried nothing for the symptoms. The treatment provided no relief.  Back Pain  This is a new problem. The current episode started more than 2 days ago. The problem occurs constantly. The problem has been gradually worsening. The pain is associated with no known injury. The pain is present in the lumbar spine. The pain is moderate. The symptoms are aggravated by certain positions. The pain is the same all the time. Associated symptoms include headaches. Pertinent negatives include no fever, no perianal numbness, no bladder incontinence, no dysuria and no tingling. He has tried nothing for the symptoms. The treatment provided no relief.    [name] is a 51 y.o. male  who presents to the Emergency Department complaining of sudden, progressively worsening, headache, onset three days ago with associated symptoms of back pain, light headedness, productive cough, and congestion. Modifying factors include application of nasal spray which does not provide relief. The pt has a hx of migraine headaches.    The pt denies prostate gland problems.   The  pt is a current everyday smoker and drinks alcohol.   PCP is Dr. Yetta Barre   Past Medical History  Diagnosis Date  . Osteoarthrosis, unspecified whether generalized or localized, shoulder region   . Spondylosis of unspecified site without mention of myelopathy   . Spinal stenosis in cervical region   . Brachial neuritis or radiculitis NOS   . Tobacco use disorder   . Other diseases of nasal cavity and sinuses   . History of nephrolithiasis   . Deaf     Past Surgical History  Procedure Date  . Knee surgery   . Hand surgery   . Cholecystectomy   . Tonsillectomy     Family History  Problem Relation Age of Onset  . Heart disease Maternal Grandfather   . Heart disease Paternal Grandfather     History  Substance Use Topics  . Smoking status: Current Everyday Smoker  . Smokeless tobacco: Not on file  . Alcohol Use: Yes      Review of Systems  Constitutional: Negative for fever.  Gastrointestinal: Negative for nausea and vomiting.  Genitourinary: Negative for bladder incontinence and dysuria.  Musculoskeletal: Positive for back pain.  Neurological: Positive for headaches. Negative for tingling.  All other systems reviewed and are negative.    Allergies  Hydrocodone-acetaminophen  Home Medications   Current Outpatient Rx  Name Route Sig Dispense Refill  . CROMOLYN SODIUM 5.2 MG/ACT NA AERS Nasal Place 1 spray into the nose 4 (four) times daily.    . AMOXICILLIN-POT CLAVULANATE 500-125 MG PO TABS Oral Take 1 tablet (500 mg total)  by mouth every 8 (eight) hours. 10 tablet 0  . IBUPROFEN 600 MG PO TABS Oral Take 1 tablet (600 mg total) by mouth every 6 (six) hours as needed for pain. 30 tablet 0  . OXYCODONE-ACETAMINOPHEN 7.5-325 MG PO TABS Oral Take 1 tablet by mouth every 4 (four) hours as needed for pain. 30 tablet 0  . SILVER SULFADIAZINE 1 % EX CREA Topical Apply 1 application topically daily as needed. For burn      BP 109/79  Pulse 68  Temp 98 F (36.7 C)  (Oral)  Resp 16  SpO2 96%  Physical Exam  Nursing note and vitals reviewed. Constitutional: He is oriented to person, place, and time. He appears well-developed. No distress.  HENT:  Head: Normocephalic and atraumatic.    Eyes: Pupils are equal, round, and reactive to light.  Neck: Normal range of motion.  Cardiovascular: Normal rate and intact distal pulses.   Pulmonary/Chest: No respiratory distress.  Abdominal: Normal appearance. He exhibits no distension.  Musculoskeletal: Normal range of motion.       Back:  Neurological: He is alert and oriented to person, place, and time. No cranial nerve deficit.  Skin: Skin is warm and dry. No rash noted.  Psychiatric: He has a normal mood and affect. His behavior is normal.    ED Course  Procedures (including critical care time)  DIAGNOSTIC STUDIES: Oxygen Saturation is 96% on room air, adequate by my interpretation.    COORDINATION OF CARE:    2:33PM- Sinusitis and pain management discussed. Pt agrees to treatment.   Labs Reviewed - No data to display No results found.   1. Sinusitis   2. Low back pain       MDM         I personally performed the services described in this documentation, which was scribed in my presence. The recorded information has been reviewed and considered.    Nelia Shi, MD 12/23/11 [phone]

## 2014-07-14 ENCOUNTER — Emergency Department (HOSPITAL_COMMUNITY): Payer: Medicare Other

## 2014-07-14 ENCOUNTER — Emergency Department (HOSPITAL_COMMUNITY)
Admission: EM | Admit: 2014-07-14 | Discharge: 2014-07-14 | Disposition: A | Discharge status: Home / Self Care | Payer: Medicare Other | Attending: Emergency Medicine | Admitting: Emergency Medicine

## 2014-07-14 ENCOUNTER — Encounter (HOSPITAL_COMMUNITY): Payer: Self-pay

## 2014-07-14 DIAGNOSIS — N132 Hydronephrosis with renal and ureteral calculous obstruction: Secondary | ICD-10-CM | POA: Diagnosis not present

## 2014-07-14 DIAGNOSIS — K2970 Gastritis, unspecified, without bleeding: Secondary | ICD-10-CM | POA: Diagnosis not present

## 2014-07-14 DIAGNOSIS — H9190 Unspecified hearing loss, unspecified ear: Secondary | ICD-10-CM | POA: Insufficient documentation

## 2014-07-14 DIAGNOSIS — R1031 Right lower quadrant pain: Secondary | ICD-10-CM | POA: Diagnosis present

## 2014-07-14 DIAGNOSIS — R109 Unspecified abdominal pain: Secondary | ICD-10-CM | POA: Diagnosis not present

## 2014-07-14 DIAGNOSIS — N049 Nephrotic syndrome with unspecified morphologic changes: Secondary | ICD-10-CM | POA: Diagnosis not present

## 2014-07-14 DIAGNOSIS — Z79899 Other long term (current) drug therapy: Secondary | ICD-10-CM | POA: Insufficient documentation

## 2014-07-14 DIAGNOSIS — N201 Calculus of ureter: Secondary | ICD-10-CM | POA: Diagnosis not present

## 2014-07-14 DIAGNOSIS — M1990 Unspecified osteoarthritis, unspecified site: Secondary | ICD-10-CM | POA: Insufficient documentation

## 2014-07-14 DIAGNOSIS — Z720 Tobacco use: Secondary | ICD-10-CM | POA: Diagnosis not present

## 2014-07-14 DIAGNOSIS — R112 Nausea with vomiting, unspecified: Secondary | ICD-10-CM | POA: Diagnosis not present

## 2014-07-14 LAB — URINALYSIS, ROUTINE W REFLEX MICROSCOPIC
BILIRUBIN URINE: NEGATIVE
GLUCOSE, UA: NEGATIVE mg/dL
KETONES UR: NEGATIVE mg/dL
Leukocytes, UA: NEGATIVE
Nitrite: NEGATIVE
PH: 5 (ref 5.0–8.0)
PROTEIN: NEGATIVE mg/dL
Specific Gravity, Urine: 1.019 (ref 1.005–1.030)
Urobilinogen, UA: 0.2 mg/dL (ref 0.0–1.0)

## 2014-07-14 LAB — URINE MICROSCOPIC-ADD ON

## 2014-07-14 MED ORDER — HYDROMORPHONE HCL 1 MG/ML IJ SOLN
1.0000 mg | Freq: Once | INTRAMUSCULAR | Status: AC
  Administered 2014-07-14: 1 mg via INTRAVENOUS
  Filled 2014-07-14: qty 1

## 2014-07-14 MED ORDER — FENTANYL CITRATE 0.05 MG/ML IJ SOLN
50.0000 ug | Freq: Once | INTRAMUSCULAR | Status: AC
  Administered 2014-07-14: 50 ug via INTRAVENOUS
  Filled 2014-07-14: qty 2

## 2014-07-14 MED ORDER — OXYCODONE-ACETAMINOPHEN 5-325 MG PO TABS: 1.0000 | ORAL_TABLET | Freq: Four times a day (QID) | ORAL | Status: DC | PRN

## 2014-07-14 MED ORDER — SODIUM CHLORIDE 0.9 % IV SOLN
INTRAVENOUS | Status: DC
  Administered 2014-07-14: 16:00:00 via INTRAVENOUS

## 2014-07-14 MED ORDER — KETOROLAC TROMETHAMINE 30 MG/ML IJ SOLN
30.0000 mg | Freq: Once | INTRAMUSCULAR | Status: AC
  Administered 2014-07-14: 30 mg via INTRAVENOUS
  Filled 2014-07-14: qty 1

## 2014-07-14 MED ORDER — ONDANSETRON HCL 4 MG/2ML IJ SOLN
4.0000 mg | Freq: Once | INTRAMUSCULAR | Status: AC
  Administered 2014-07-14: 4 mg via INTRAVENOUS
  Filled 2014-07-14: qty 2

## 2014-07-14 NOTE — Discharge Instructions (Signed)
It was our pleasure to provide your ER care today - we hope that you feel better.  Drink plenty of fluids. Strain urine.  Take motrin or aleve as need for pain.   You may also take percocet as need for pain - no driving for the next 6 hours or when taking percocet. Also, do not take tylenol or acetaminophen containing medications when taking percocet.   Follow up with urologist in next few days if symptoms fail to resolve - call office to arrange appointment.   Return to ER if worse, intractable pain, fevers, persistent vomiting, other concern.     Kidney Stones Kidney stones (urolithiasis) are deposits that form inside your kidneys. The intense pain is caused by the stone moving through the urinary tract. When the stone moves, the ureter goes into spasm around the stone. The stone is usually passed in the urine.  CAUSES   A disorder that makes certain neck glands produce too much parathyroid hormone (primary hyperparathyroidism).  A buildup of uric acid crystals, similar to gout in your joints.  Narrowing (stricture) of the ureter.  A kidney obstruction present at birth (congenital obstruction).  Previous surgery on the kidney or ureters.  Numerous kidney infections. SYMPTOMS   Feeling sick to your stomach (nauseous).  Throwing up (vomiting).  Blood in the urine (hematuria).  Pain that usually spreads (radiates) to the groin.  Frequency or urgency of urination. DIAGNOSIS   Taking a history and physical exam.  Blood or urine tests.  CT scan.  Occasionally, an examination of the inside of the urinary bladder (cystoscopy) is performed. TREATMENT   Observation.  Increasing your fluid intake.  Extracorporeal shock wave lithotripsy--This is a noninvasive procedure that uses shock waves to break up kidney stones.  Surgery may be needed if you have severe pain or persistent obstruction. There are various surgical procedures. Most of the procedures are performed with  the use of small instruments. Only small incisions are needed to accommodate these instruments, so recovery time is minimized. The size, location, and chemical composition are all important variables that will determine the proper choice of action for you. Talk to your health care provider to better understand your situation so that you will minimize the risk of injury to yourself and your kidney.  HOME CARE INSTRUCTIONS   Drink enough water and fluids to keep your urine clear or pale yellow. This will help you to pass the stone or stone fragments.  Strain all urine through the provided strainer. Keep all particulate matter and stones for your health care provider to see. The stone causing the pain may be as small as a grain of salt. It is very important to use the strainer each and every time you pass your urine. The collection of your stone will allow your health care provider to analyze it and verify that a stone has actually passed. The stone analysis will often identify what you can do to reduce the incidence of recurrences.  Only take over-the-counter or prescription medicines for pain, discomfort, or fever as directed by your health care provider.  Make a follow-up appointment with your health care provider as directed.  Get follow-up X-rays if required. The absence of pain does not always mean that the stone has passed. It may have only stopped moving. If the urine remains completely obstructed, it can cause loss of kidney function or even complete destruction of the kidney. It is your responsibility to make sure X-rays and follow-ups are completed. Ultrasounds  of the kidney can show blockages and the status of the kidney. Ultrasounds are not associated with any radiation and can be performed easily in a matter of minutes. SEEK MEDICAL CARE IF:  You experience pain that is progressive and unresponsive to any pain medicine you have been prescribed. SEEK IMMEDIATE MEDICAL CARE IF:   Pain  cannot be controlled with the prescribed medicine.  You have a fever or shaking chills.  The severity or intensity of pain increases over 18 hours and is not relieved by pain medicine.  You develop a new onset of abdominal pain.  You feel faint or pass out.  You are unable to urinate. MAKE SURE YOU:   Understand these instructions.  Will watch your condition.  Will get help right away if you are not doing well or get worse. Document Released: 04/13/2005 Document Revised: 12/14/2012 Document Reviewed: 09/14/2012 [facility] Patient Information 2015 [place], [place]. This information is not intended to replace advice given to you by your health care provider. Make sure you discuss any questions you have with your health care provider.

## 2014-07-14 NOTE — ED Provider Notes (Signed)
CSN: [id number]     Arrival date & time 07/14/14  1253 History   First MD Initiated Contact with Patient 07/14/14 1504     Chief Complaint  Patient presents with  . Flank Pain     (Consider location/radiation/quality/duration/timing/severity/associated sxs/prior Treatment) Patient is a 54 y.o. male presenting with flank pain. The history is provided by the patient.  Flank Pain Pertinent negatives include no chest pain, no abdominal pain, no headaches and no shortness of breath.  pt c/o right flank pain for the past day, constant, dull, severe. In right flank posteriorly and mid abdomen. Waxes and wanes in intensity. States at first he thought may have been kidney stone, as pain was somewhat similar to past kidney stones, but that he passed a small stone, pain is no better, and in fact different or worse. Had bm today, normal. Nausea, vomiting x 1, not bloody or bilious. No abd distension. No dysuria or hematuria. No fever or chills.      Past Medical History  Diagnosis Date  . Osteoarthrosis, unspecified whether generalized or localized, shoulder region   . Spondylosis of unspecified site without mention of myelopathy   . Spinal stenosis in cervical region   . Brachial neuritis or radiculitis NOS   . Tobacco use disorder   . Other diseases of nasal cavity and sinuses(478.19)   . History of nephrolithiasis   . Deaf    Past Surgical History  Procedure Laterality Date  . Knee surgery    . Hand surgery    . Cholecystectomy    . Tonsillectomy     Family History  Problem Relation Age of Onset  . Heart disease Maternal Grandfather   . Heart disease Paternal Grandfather    History  Substance Use Topics  . Smoking status: Current Every Day Smoker  . Smokeless tobacco: Not on file  . Alcohol Use: Yes    Review of Systems  Constitutional: Negative for fever and chills.  HENT: Negative for sore throat.   Eyes: Negative for redness.  Respiratory: Negative for shortness of  breath.   Cardiovascular: Negative for chest pain.  Gastrointestinal: Negative for vomiting, abdominal pain and diarrhea.  Endocrine: Negative for polyuria.  Genitourinary: Positive for flank pain. Negative for dysuria.  Musculoskeletal: Negative for neck pain.  Skin: Negative for rash.  Neurological: Negative for headaches.  Hematological: Does not bruise/bleed easily.  Psychiatric/Behavioral: Negative for confusion.      Allergies  Hydrocodone-acetaminophen  Home Medications   Prior to Admission medications   Medication Sig Start Date End Date Taking? Authorizing Provider  cromolyn (NASALCROM) 5.2 MG/ACT nasal spray Place 1 spray into the nose 4 (four) times daily. 07/23/11 07/22/12  Clayton Bibles, PA-C   BP 150/93 mmHg  Pulse 52  Temp(Src) 97.4 F (36.3 C) (Oral)  Resp 18  SpO2 97% Physical Exam  Constitutional: He is oriented to person, place, and time. He appears well-developed and well-nourished. No distress.  HENT:  Mouth/Throat: Oropharynx is clear and moist.  Eyes: Conjunctivae are normal. No scleral icterus.  Neck: Neck supple. No tracheal deviation present.  Cardiovascular: Normal rate, regular rhythm, normal heart sounds and intact distal pulses.   Pulmonary/Chest: Effort normal and breath sounds normal. No accessory muscle usage. No respiratory distress.  Abdominal: Soft. Bowel sounds are normal. He exhibits no distension and no mass. There is no tenderness. There is no rebound and no guarding.  Genitourinary:  No cva tenderness. No scrotal or testicular tenderness.   Musculoskeletal: Normal range of  motion. He exhibits no edema.  Neurological: He is alert and oriented to person, place, and time.  Communicates by signing via interpreter.   Skin: Skin is warm and dry. He is not diaphoretic.  Psychiatric: He has a normal mood and affect.  Nursing note and vitals reviewed.   ED Course  Procedures (including critical care time) Labs Review  Results for orders  placed or performed during the hospital encounter of 07/14/14  U/A (may I&O cath if menses)  Result Value Ref Range   Color, Urine YELLOW YELLOW   APPearance CLEAR CLEAR   Specific Gravity, Urine 1.019 1.005 - 1.030   pH 5.0 5.0 - 8.0   Glucose, UA NEGATIVE NEGATIVE mg/dL   Hgb urine dipstick LARGE (A) NEGATIVE   Bilirubin Urine NEGATIVE NEGATIVE   Ketones, ur NEGATIVE NEGATIVE mg/dL   Protein, ur NEGATIVE NEGATIVE mg/dL   Urobilinogen, UA 0.2 0.0 - 1.0 mg/dL   Nitrite NEGATIVE NEGATIVE   Leukocytes, UA NEGATIVE NEGATIVE  Urine microscopic-add on  Result Value Ref Range   WBC, UA 0-2 <3 WBC/hpf   RBC / HPF 21-50 <3 RBC/hpf   Bacteria, UA RARE RARE   Urine-Other MUCOUS PRESENT    Ct Abdomen Pelvis Wo Contrast  07/14/2014   CLINICAL DATA:  Right flank pain, history of kidney stones.  EXAM: CT ABDOMEN AND PELVIS WITHOUT CONTRAST  TECHNIQUE: Multidetector CT imaging of the abdomen and pelvis was performed following the standard protocol without IV contrast.  COMPARISON:  07/11/2010  FINDINGS: Large cyst or bulla within the left lung base. Dependent atelectasis bilaterally. Trace pleural effusions or pleural thickening. Normal heart size. Trace pericardial fluid.  Intra-abdominal organ evaluation is limited in the absence of intravenous contrast. Within this limitation, no appreciable abnormality of the liver, spleen, pancreas, adrenal glands. Cholecystectomy. No biliary ductal dilatation. Lobular left renal contour. Right renal edema and perinephric fat stranding. Multiple right renal stones. Rounded mixed attenuation focus lower pole right kidney measures 2.4 cm. Mild right hydroureteronephrosis to the level of a 5 mm stone at the right UVJ. High attenuation within the bladder may reflect previously passed stones.  No overt colitis. Normal appendix. Small bowel loops are of normal course and caliber. Tiny fat containing umbilical hernia. No free intraperitoneal air or fluid. No lymphadenopathy.   Scattered atherosclerotic disease of the aorta and branch vessels without aneurysmal dilatation.  Rounded densities within the right anterolateral subcutaneous fat may correspond to prior injection sites.  Prostate gland measures 4.7 cm transverse diameter. Tiny fat containing right inguinal hernia and/or spermatic cord lipoma.  Bilateral iliac bone small rounded sclerotic foci are similar to prior and favored to reflect bone islands. Multilevel degenerative changes, most pronounced at L5-S1.  IMPRESSION: Right renal edema, perinephric fat stranding, and mild hydroureteronephrosis to the level of a 5 mm UVJ stone.  Additional nonobstructing stones on the right.  Rounded mixed attenuation/high-density within the lower pole right kidney measures 2.4 cm and may reflect a complex cyst with hemorrhagic/ proteinaceous fluid and mineralization, solid neoplasm, or debris containing diverticulum. Recommend follow-up renal protocol MRI or CT in the nonemergent setting to further characterize.   Electronically Signed   By: Carlos Levering M.D.   On: 07/14/2014 17:12      MDM   Iv ns. Labs. Ct.  Dilaudid iv. toradol iv. zofran iv.  Reviewed nursing notes and prior charts for additional history.   Discussed ct w pt via interpreter.  Recheck pain improved.  Pain returns. abd soft nt.  Dilaudid 1 mg iv.  Recheck pain improve/resolved.  Pt currently appears stable for d/c. Urology follow up.     Lajean Saver, MD 07/14/14 [phone]

## 2014-07-14 NOTE — ED Notes (Signed)
He states he has had several kidney stones in the past.  He states he saw one that he passed a couple of days ago.  He is here today with c/o right flank pain, which he finds a bit strange as it occurred after he passed the stone.  He arrives in no distress, having received IV Fentanyl and Zofran en route to hospital.  He is deaf; and we have had our secretary call our deaf interpreter.

## 2014-07-31 ENCOUNTER — Encounter (HOSPITAL_COMMUNITY): Payer: Self-pay

## 2014-07-31 ENCOUNTER — Emergency Department (HOSPITAL_COMMUNITY)
Admission: EM | Admit: 2014-07-31 | Discharge: 2014-07-31 | Disposition: A | Discharge status: Home / Self Care | Payer: Medicare Other | Attending: Emergency Medicine | Admitting: Emergency Medicine

## 2014-07-31 DIAGNOSIS — Z8709 Personal history of other diseases of the respiratory system: Secondary | ICD-10-CM | POA: Diagnosis not present

## 2014-07-31 DIAGNOSIS — M5441 Lumbago with sciatica, right side: Secondary | ICD-10-CM

## 2014-07-31 DIAGNOSIS — R200 Anesthesia of skin: Secondary | ICD-10-CM | POA: Diagnosis not present

## 2014-07-31 DIAGNOSIS — Z720 Tobacco use: Secondary | ICD-10-CM | POA: Diagnosis not present

## 2014-07-31 DIAGNOSIS — M545 Low back pain: Secondary | ICD-10-CM | POA: Diagnosis present

## 2014-07-31 DIAGNOSIS — M5442 Lumbago with sciatica, left side: Secondary | ICD-10-CM

## 2014-07-31 DIAGNOSIS — M5440 Lumbago with sciatica, unspecified side: Secondary | ICD-10-CM | POA: Insufficient documentation

## 2014-07-31 DIAGNOSIS — H9190 Unspecified hearing loss, unspecified ear: Secondary | ICD-10-CM | POA: Insufficient documentation

## 2014-07-31 DIAGNOSIS — Z87442 Personal history of urinary calculi: Secondary | ICD-10-CM | POA: Diagnosis not present

## 2014-07-31 DIAGNOSIS — Z79899 Other long term (current) drug therapy: Secondary | ICD-10-CM | POA: Insufficient documentation

## 2014-07-31 MED ORDER — NAPROXEN 500 MG PO TABS: 500.0000 mg | ORAL_TABLET | Freq: Two times a day (BID) | ORAL | Status: DC

## 2014-07-31 NOTE — Discharge Instructions (Signed)
Back Pain, Adult °Low back pain is very common. About 1 in 5 people have back pain. The cause of low back pain is rarely dangerous. The pain often gets better over time. About half of people with a sudden onset of back pain feel better in just 2 weeks. About 8 in 10 people feel better by 6 weeks.  °CAUSES °Some common causes of back pain include: °· Strain of the muscles or ligaments supporting the spine. °· Wear and tear (degeneration) of the spinal discs. °· Arthritis. °· Direct injury to the back. °DIAGNOSIS °Most of the time, the direct cause of low back pain is not known. However, back pain can be treated effectively even when the exact cause of the pain is unknown. Answering your caregiver's questions about your overall health and symptoms is one of the most accurate ways to make sure the cause of your pain is not dangerous. If your caregiver needs more information, he or she may order lab work or imaging tests (X-rays or MRIs). However, even if imaging tests show changes in your back, this usually does not require surgery. °HOME CARE INSTRUCTIONS °For many people, back pain returns. Since low back pain is rarely dangerous, it is often a condition that people can learn to manage on their own.  °· Remain active. It is stressful on the back to sit or stand in one place. Do not sit, drive, or stand in one place for more than 30 minutes at a time. Take short walks on level surfaces as soon as pain allows. Try to increase the length of time you walk each day. °· Do not stay in bed. Resting more than 1 or 2 days can delay your recovery. °· Do not avoid exercise or work. Your body is made to move. It is not dangerous to be active, even though your back may hurt. Your back will likely heal faster if you return to being active before your pain is gone. °· Pay attention to your body when you  bend and lift. Many people have less discomfort when lifting if they bend their knees, keep the load close to their bodies, and  avoid twisting. Often, the most comfortable positions are those that put less stress on your recovering back. °· Find a comfortable position to sleep. Use a firm mattress and lie on your side with your knees slightly bent. If you lie on your back, put a pillow under your knees. °· Only take over-the-counter or prescription medicines as directed by your caregiver. Over-the-counter medicines to reduce pain and inflammation are often the most helpful. Your caregiver may prescribe muscle relaxant drugs. These medicines help dull your pain so you can more quickly return to your normal activities and healthy exercise. °· Put ice on the injured area. °· Put ice in a plastic bag. °· Place a towel between your skin and the bag. °· Leave the ice on for 15-20 minutes, 03-04 times a day for the first 2 to 3 days. After that, ice and heat may be alternated to reduce pain and spasms. °· Ask your caregiver about trying back exercises and gentle massage. This may be of some benefit. °· Avoid feeling anxious or stressed. Stress increases muscle tension and can worsen back pain. It is important to recognize when you are anxious or stressed and learn ways to manage it. Exercise is a great option. °SEEK MEDICAL CARE IF: °· You have pain that is not relieved with rest or medicine. °· You have pain that does not improve in 1 week. °· You have new symptoms. °· You are generally not feeling well. °SEEK   IMMEDIATE MEDICAL CARE IF:   You have pain that radiates from your back into your legs.  You develop new bowel or bladder control problems.  You have unusual weakness or numbness in your arms or legs.  You develop nausea or vomiting.  You develop abdominal pain.  You feel faint. Document Released: 04/13/2005 Document Revised: 10/13/2011 Document Reviewed: 08/15/2013 [facility] Patient Information 2015 [place], [place]. This information is not intended to replace advice given to you by your health care provider. Make sure you  discuss any questions you have with your health care provider.  Back Exercises Back exercises help treat and prevent back injuries. The goal of back exercises is to increase the strength of your abdominal and back muscles and the flexibility of your back. These exercises should be started when you no longer have back pain. Back exercises include:  Pelvic Tilt. Lie on your back with your knees bent. Tilt your pelvis until the lower part of your back is against the floor. Hold this position 5 to 10 sec and repeat 5 to 10 times.  Knee to Chest. Pull first 1 knee up against your chest and hold for 20 to 30 seconds, repeat this with the other knee, and then both knees. This may be done with the other leg straight or bent, whichever feels better.  Sit-Ups or Curl-Ups. Bend your knees 90 degrees. Start with tilting your pelvis, and do a partial, slow sit-up, lifting your trunk only 30 to 45 degrees off the floor. Take at least 2 to 3 seconds for each sit-up. Do not do sit-ups with your knees out straight. If partial sit-ups are difficult, simply do the above but with only tightening your abdominal muscles and holding it as directed.  Hip-Lift. Lie on your back with your knees flexed 90 degrees. Push down with your feet and shoulders as you raise your hips a couple inches off the floor; hold for 10 seconds, repeat 5 to 10 times.  Back arches. Lie on your stomach, propping yourself up on bent elbows. Slowly press on your hands, causing an arch in your low back. Repeat 3 to 5 times. Any initial stiffness and discomfort should lessen with repetition over time.  Shoulder-Lifts. Lie face down with arms beside your body. Keep hips and torso pressed to floor as you slowly lift your head and shoulders off the floor. Do not overdo your exercises, especially in the beginning. Exercises may cause you some mild back discomfort which lasts for a few minutes; however, if the pain is more severe, or lasts for more than 15  minutes, do not continue exercises until you see your caregiver. Improvement with exercise therapy for back problems is slow.  See your caregivers for assistance with developing a proper back exercise program. Document Released: 05/21/2004 Document Revised: 07/06/2011 Document Reviewed: 02/12/2011 [facility] Patient Information 2015 [place], [place]. This information is not intended to replace advice given to you by your health care provider. Make sure you discuss any questions you have with your health care provider. Back Injury Prevention Back injuries can be extremely painful and difficult to heal. After having one back injury, you are much more likely to experience another later on. It is important to learn how to avoid injuring or re-injuring your back. The following tips can help you to prevent a back injury. PHYSICAL FITNESS  Exercise regularly and try to develop good tone in your abdominal muscles. Your abdominal muscles provide a lot of the support needed by your back.  Do aerobic exercises (walking, jogging, biking, swimming) regularly. °· Do exercises that increase balance and strength (tai chi, yoga) regularly. This can decrease your risk of falling and injuring your back. °· Stretch before and after exercising. °· Maintain a healthy weight. The more you weigh, the more stress is placed on your back. For every pound of weight, 10 times that amount of pressure is placed on the back. °DIET °· Talk to your caregiver about how much calcium and vitamin D you need per day. These nutrients help to prevent weakening of the bones (osteoporosis). Osteoporosis can cause broken (fractured) bones that lead to back pain. °· Include good sources of calcium in your diet, such as dairy products, green, leafy vegetables, and products with calcium added (fortified). °· Include good sources of vitamin D in your diet, such as milk and foods that are fortified with vitamin D. °· Consider taking a nutritional  supplement or a multivitamin if needed. °· Stop smoking if you smoke. °POSTURE °· Sit and stand up straight. Avoid leaning forward when you sit or hunching over when you stand. °· Choose chairs with good low back (lumbar) support. °· If you work at a desk, sit close to your work so you do not need to lean over. Keep your chin tucked in. Keep your neck drawn back and elbows bent at a right angle. Your arms should look like the letter "L." °· Sit high and close to the steering wheel when you drive. Add a lumbar support to your car seat if needed. °· Avoid sitting or standing in one position for too long. Take breaks to get up, stretch, and walk around at least once every hour. Take breaks if you are driving for long periods of time. °· Sleep on your side with your knees slightly bent, or sleep on your back with a pillow under your knees. Do not sleep on your stomach. °LIFTING, TWISTING, AND REACHING °· Avoid heavy lifting, especially repetitive lifting. If you must do heavy lifting: °¨ Stretch before lifting. °¨ Work slowly. °¨ Rest between lifts. °¨ Use carts and dollies to move objects when possible. °¨ Make several small trips instead of carrying 1 heavy load. °¨ Ask for help when you need it. °¨ Ask for help when moving big, awkward objects. °· Follow these steps when lifting: °¨ Stand with your feet shoulder-width apart. °¨ Get as close to the object as you can. Do not try to pick up heavy objects that are far from your body. °¨ Use handles or lifting straps if they are available. °¨ Bend at your knees. Squat down, but keep your heels off the floor. °¨ Keep your shoulders pulled back, your chin tucked in, and your back straight. °¨ Lift the object slowly, tightening the muscles in your legs, abdomen, and buttocks. Keep the object as close to the center of your body as possible. °¨ When you put a load down, use these same guidelines in reverse. °· Do not: °¨ Lift the object above your waist. °¨ Twist at the waist  while lifting or carrying a load. Move your feet if you need to turn, not your waist. °¨ Bend over without bending at your knees. °· Avoid reaching over your head, across a table, or for an object on a high surface. °OTHER TIPS °· Avoid wet floors and keep sidewalks clear of ice to prevent falls. °· Do not sleep on a mattress that is too soft or too hard. °· Keep items that are   used frequently within easy reach.  Put heavier objects on shelves at waist level and lighter objects on lower or higher shelves.  Find ways to decrease your stress, such as exercise, massage, or relaxation techniques. Stress can build up in your muscles. Tense muscles are more vulnerable to injury.  Seek treatment for depression or anxiety if needed. These conditions can increase your risk of developing back pain. SEEK MEDICAL CARE IF:  You injure your back.  You have questions about diet, exercise, or other ways to prevent back injuries. MAKE SURE YOU:  Understand these instructions.  Will watch your condition.  Will get help right away if you are not doing well or get worse. Document Released: 05/21/2004 Document Revised: 07/06/2011 Document Reviewed: 05/25/2011 [facility] Patient Information 2015 [place], [place]. This information is not intended to replace advice given to you by your health care provider. Make sure you discuss any questions you have with your health care provider.

## 2014-07-31 NOTE — ED Notes (Signed)
Interpretor called for pt. Arrival in 10 min.  Pt stable in lobby.

## 2014-07-31 NOTE — ED Notes (Addendum)
Pt states pain in back for over a year.  No change in urination.  Pain is not new and worse with movement and first thing in the morning.  Cannot lay fat.  Had kidney stone recently but he denies that this is the same.  No fever.

## 2014-07-31 NOTE — ED Provider Notes (Signed)
CSN: [id number]     Arrival date & time 07/31/14  1402 History  This chart was scribed for non-physician practitioner, Waynetta Pean, working with Pamella Pert, MD by Molli Posey, ED Scribe. This patient was seen in room [place] and the patient's care was started at 4:53 PM.  Chief Complaint  Patient presents with  . Back Pain   The history is provided by the patient. A language interpreter was used (Sign language interpreter ).   HPI Comments: [name] is a 54 y.o. male with a history of osteoarthrosis and cervical spinal stenosis who presents to the Emergency Department complaining of recurrent lower back pain for over a year that worsened recently. Pt states that his pain sometimes radiates to his buttocks and pelvis. He states that he intermittently experiences numbness as well. Pt states that heat and movement makes his pain worse and says that ice provides him relief. Pt states he has taken no medications PTA and states that OTC medications have failed to relieve his pain. Pt reports no history of CA. Pt denies any IV drug use. He denies any bowel or bladder incontinence, difficulty urinating, dysuria, frequency, hematuria, nausea or vomiting. He denies trauma or injury to his back recently.   Past Medical History  Diagnosis Date  . Osteoarthrosis, unspecified whether generalized or localized, shoulder region   . Spondylosis of unspecified site without mention of myelopathy   . Spinal stenosis in cervical region   . Brachial neuritis or radiculitis NOS   . Tobacco use disorder   . Other diseases of nasal cavity and sinuses(478.19)   . History of nephrolithiasis   . Deaf    Past Surgical History  Procedure Laterality Date  . Knee surgery    . Hand surgery    . Cholecystectomy    . Tonsillectomy     Family History  Problem Relation Age of Onset  . Heart disease Maternal Grandfather   . Heart disease Paternal Grandfather    History  Substance Use Topics  .  Smoking status: Current Every Day Smoker  . Smokeless tobacco: Not on file  . Alcohol Use: Yes    Review of Systems  Constitutional: Negative for fever.  Gastrointestinal: Negative for nausea, vomiting, abdominal pain and diarrhea.  Genitourinary: Negative for dysuria, urgency, frequency, hematuria and difficulty urinating.  Musculoskeletal: Positive for back pain. Negative for gait problem, neck pain and neck stiffness.  Skin: Negative for rash.  Neurological: Positive for numbness. Negative for dizziness, weakness and headaches.    Allergies  Hydrocodone-acetaminophen  Home Medications   Prior to Admission medications   Medication Sig Start Date End Date Taking? Authorizing Provider  OVER THE COUNTER MEDICATION Take 1-2 tablets by mouth once. Pain medication.   Yes Historical Provider, MD  cromolyn (NASALCROM) 5.2 MG/ACT nasal spray Place 1 spray into the nose 4 (four) times daily. 07/23/11 07/22/12  Clayton Bibles, PA-C  naproxen (NAPROSYN) 500 MG tablet Take 1 tablet (500 mg total) by mouth 2 (two) times daily with a meal. 07/31/14   Waynetta Pean, PA-C  oxyCODONE-acetaminophen (PERCOCET/ROXICET) 5-325 MG per tablet Take 1-2 tablets by mouth every 6 (six) hours as needed for severe pain. Patient not taking: Reported on 07/31/2014 07/14/14   Lajean Saver, MD   BP 137/88 mmHg  Pulse 55  Temp(Src) 97.9 F (36.6 C) (Oral)  Resp 18  SpO2 99% Physical Exam  Constitutional: He is oriented to person, place, and time. He appears well-developed and well-nourished. No distress.  Able  to ambulate without difficulty or assistance.  HENT:  Head: Normocephalic and atraumatic.  Eyes: Right eye exhibits no discharge. Left eye exhibits no discharge.  Neck: Neck supple. No tracheal deviation present.  Cardiovascular: Normal rate, regular rhythm, normal heart sounds and intact distal pulses.   Pulses:      Dorsalis pedis pulses are 2+ on the right side, and 2+ on the left side.       Posterior  tibial pulses are 2+ on the right side, and 2+ on the left side.  Pulmonary/Chest: Effort normal and breath sounds normal. No respiratory distress. He has no wheezes. He has no rales.  Abdominal: He exhibits no distension.  Musculoskeletal:  No back tenderness. No back edema, crepitus, step offs, or deformities. Strength 5/5 in his bilateral lower extremities. He is able to ambulate without difficulty or assistance.   Neurological: He is alert and oriented to person, place, and time. He has normal reflexes. He displays normal reflexes. Coordination normal.  Bilateral patellar DTRs intact. Sensation intact in bilateral lower extremities.   Skin: Skin is warm and dry. No rash noted. He is not diaphoretic. No erythema. No pallor.  Psychiatric: He has a normal mood and affect. His behavior is normal.  Nursing note and vitals reviewed.   ED Course  Procedures  DIAGNOSTIC STUDIES: Oxygen Saturation is 98% on RA, normal by my interpretation.    COORDINATION OF CARE: 5:10 PM Discussed treatment plan with pt at bedside and pt agreed to plan.   Labs Review Labs Reviewed - No data to display  Imaging Review No results found.   EKG Interpretation None      Filed Vitals:   07/31/14 1500 07/31/14 1716  BP: 137/88   Pulse: 68 55  Temp: 97.9 F (36.6 C)   TempSrc: Oral   Resp: 18   SpO2: 98% 99%     MDM   Meds given in ED:  Medications - No data to display  New Prescriptions   NAPROXEN (NAPROSYN) 500 MG TABLET    Take 1 tablet (500 mg total) by mouth 2 (two) times daily with a meal.    Final diagnoses:  Bilateral low back pain with sciatica, sciatica laterality unspecified   Patient with back pain with sciatica ongoing for more than one year. He denies recent injury or trauma.  No neurological deficits and normal neuro exam.  Patient can walk without difficulty or assistance..  No loss of bowel or bladder control.  No concern for cauda equina.  No fever, night sweats, weight  loss, h/o cancer, IVDU.  RICE protocol and pain medicine indicated and discussed with patient.  I advised the patient to follow-up with their primary care provider this week. I advised the patient to return to the emergency department with new or worsening symptoms or new concerns. The patient verbalized understanding and agreement with plan.   I personally performed the services described in this documentation, which was scribed in my presence. The recorded information has been reviewed and is accurate.       Waynetta Pean, PA-C 07/31/14 1719  Pamella Pert, MD 08/01/14 [id number]

## 2014-08-21 DIAGNOSIS — M5441 Lumbago with sciatica, right side: Secondary | ICD-10-CM | POA: Diagnosis not present

## 2015-01-24 DIAGNOSIS — H903 Sensorineural hearing loss, bilateral: Secondary | ICD-10-CM | POA: Diagnosis not present

## 2015-01-25 DIAGNOSIS — H903 Sensorineural hearing loss, bilateral: Secondary | ICD-10-CM | POA: Diagnosis not present

## 2015-01-28 DIAGNOSIS — H903 Sensorineural hearing loss, bilateral: Secondary | ICD-10-CM | POA: Diagnosis not present

## 2015-01-29 DIAGNOSIS — H903 Sensorineural hearing loss, bilateral: Secondary | ICD-10-CM | POA: Diagnosis not present

## 2015-08-26 DIAGNOSIS — S42009A Fracture of unspecified part of unspecified clavicle, initial encounter for closed fracture: Secondary | ICD-10-CM

## 2015-08-26 HISTORY — DX: Fracture of unspecified part of unspecified clavicle, initial encounter for closed fracture: S42.009A

## 2015-09-22 ENCOUNTER — Emergency Department (HOSPITAL_COMMUNITY): Payer: Medicare Other

## 2015-09-22 ENCOUNTER — Inpatient Hospital Stay (HOSPITAL_COMMUNITY)
Admission: EM | Admit: 2015-09-22 | Discharge: 2015-09-26 | DRG: 200 | Disposition: A | Discharge status: Home Health | Payer: Medicare Other | Attending: General Surgery | Admitting: General Surgery

## 2015-09-22 ENCOUNTER — Encounter (HOSPITAL_COMMUNITY): Payer: Self-pay | Admitting: *Deleted

## 2015-09-22 DIAGNOSIS — S42009A Fracture of unspecified part of unspecified clavicle, initial encounter for closed fracture: Secondary | ICD-10-CM

## 2015-09-22 DIAGNOSIS — S42101A Fracture of unspecified part of scapula, right shoulder, initial encounter for closed fracture: Secondary | ICD-10-CM | POA: Diagnosis not present

## 2015-09-22 DIAGNOSIS — J939 Pneumothorax, unspecified: Secondary | ICD-10-CM | POA: Diagnosis not present

## 2015-09-22 DIAGNOSIS — K219 Gastro-esophageal reflux disease without esophagitis: Secondary | ICD-10-CM | POA: Diagnosis present

## 2015-09-22 DIAGNOSIS — S272XXA Traumatic hemopneumothorax, initial encounter: Secondary | ICD-10-CM | POA: Diagnosis present

## 2015-09-22 DIAGNOSIS — S2241XA Multiple fractures of ribs, right side, initial encounter for closed fracture: Secondary | ICD-10-CM | POA: Diagnosis present

## 2015-09-22 DIAGNOSIS — S42001A Fracture of unspecified part of right clavicle, initial encounter for closed fracture: Secondary | ICD-10-CM | POA: Diagnosis not present

## 2015-09-22 DIAGNOSIS — F17200 Nicotine dependence, unspecified, uncomplicated: Secondary | ICD-10-CM | POA: Diagnosis not present

## 2015-09-22 DIAGNOSIS — R338 Other retention of urine: Secondary | ICD-10-CM | POA: Diagnosis not present

## 2015-09-22 DIAGNOSIS — S43004A Unspecified dislocation of right shoulder joint, initial encounter: Secondary | ICD-10-CM | POA: Diagnosis not present

## 2015-09-22 DIAGNOSIS — R0602 Shortness of breath: Secondary | ICD-10-CM | POA: Diagnosis not present

## 2015-09-22 DIAGNOSIS — M4802 Spinal stenosis, cervical region: Secondary | ICD-10-CM | POA: Diagnosis present

## 2015-09-22 DIAGNOSIS — S199XXA Unspecified injury of neck, initial encounter: Secondary | ICD-10-CM | POA: Diagnosis not present

## 2015-09-22 DIAGNOSIS — S0990XA Unspecified injury of head, initial encounter: Secondary | ICD-10-CM | POA: Diagnosis not present

## 2015-09-22 DIAGNOSIS — Z79899 Other long term (current) drug therapy: Secondary | ICD-10-CM

## 2015-09-22 DIAGNOSIS — F17210 Nicotine dependence, cigarettes, uncomplicated: Secondary | ICD-10-CM | POA: Diagnosis present

## 2015-09-22 DIAGNOSIS — H9193 Unspecified hearing loss, bilateral: Secondary | ICD-10-CM | POA: Diagnosis present

## 2015-09-22 DIAGNOSIS — S3991XA Unspecified injury of abdomen, initial encounter: Secondary | ICD-10-CM | POA: Diagnosis not present

## 2015-09-22 DIAGNOSIS — S270XXA Traumatic pneumothorax, initial encounter: Secondary | ICD-10-CM | POA: Diagnosis not present

## 2015-09-22 DIAGNOSIS — M5412 Radiculopathy, cervical region: Secondary | ICD-10-CM | POA: Diagnosis present

## 2015-09-22 DIAGNOSIS — R339 Retention of urine, unspecified: Secondary | ICD-10-CM | POA: Diagnosis present

## 2015-09-22 DIAGNOSIS — Z9689 Presence of other specified functional implants: Secondary | ICD-10-CM

## 2015-09-22 DIAGNOSIS — S42031A Displaced fracture of lateral end of right clavicle, initial encounter for closed fracture: Secondary | ICD-10-CM | POA: Diagnosis not present

## 2015-09-22 DIAGNOSIS — S2231XA Fracture of one rib, right side, initial encounter for closed fracture: Secondary | ICD-10-CM

## 2015-09-22 DIAGNOSIS — D62 Acute posthemorrhagic anemia: Secondary | ICD-10-CM | POA: Diagnosis not present

## 2015-09-22 DIAGNOSIS — S42021A Displaced fracture of shaft of right clavicle, initial encounter for closed fracture: Secondary | ICD-10-CM | POA: Diagnosis not present

## 2015-09-22 DIAGNOSIS — T148 Other injury of unspecified body region: Secondary | ICD-10-CM | POA: Diagnosis not present

## 2015-09-22 DIAGNOSIS — Z885 Allergy status to narcotic agent status: Secondary | ICD-10-CM

## 2015-09-22 DIAGNOSIS — N2889 Other specified disorders of kidney and ureter: Secondary | ICD-10-CM | POA: Diagnosis present

## 2015-09-22 HISTORY — DX: Calculus of kidney: N20.0

## 2015-09-22 HISTORY — DX: Fracture of unspecified part of unspecified clavicle, initial encounter for closed fracture: S42.009A

## 2015-09-22 HISTORY — DX: Major depressive disorder, single episode, unspecified: F32.9

## 2015-09-22 HISTORY — DX: Depression, unspecified: F32.A

## 2015-09-22 LAB — COMPREHENSIVE METABOLIC PANEL
ALBUMIN: 3.5 g/dL (ref 3.5–5.0)
ALK PHOS: 84 U/L (ref 38–126)
ALT: 14 U/L — AB (ref 17–63)
AST: 25 U/L (ref 15–41)
Anion gap: 7 (ref 5–15)
BILIRUBIN TOTAL: 0.5 mg/dL (ref 0.3–1.2)
BUN: 21 mg/dL — AB (ref 6–20)
CALCIUM: 9.5 mg/dL (ref 8.9–10.3)
CO2: 22 mmol/L (ref 22–32)
CREATININE: 1.01 mg/dL (ref 0.61–1.24)
Chloride: 108 mmol/L (ref 101–111)
GFR calc Af Amer: >60 mL/min (ref >60)
GFR calc non Af Amer: >60 mL/min (ref >60)
GLUCOSE: 102 mg/dL — AB (ref 65–99)
Potassium: 4.2 mmol/L (ref 3.5–5.1)
SODIUM: 137 mmol/L (ref 135–145)
TOTAL PROTEIN: 6.7 g/dL (ref 6.5–8.1)

## 2015-09-22 LAB — CBC
HCT: 43.1 % (ref 39.0–52.0)
Hemoglobin: 14.7 g/dL (ref 13.0–17.0)
MCH: 28.9 pg (ref 26.0–34.0)
MCHC: 34.1 g/dL (ref 30.0–36.0)
MCV: 84.8 fL (ref 78.0–100.0)
PLATELETS: 253 10*3/uL (ref 150–400)
RBC: 5.08 MIL/uL (ref 4.22–5.81)
RDW: 12.3 % (ref 11.5–15.5)
WBC: 16.5 10*3/uL — ABNORMAL HIGH (ref 4.0–10.5)

## 2015-09-22 LAB — URINALYSIS, ROUTINE W REFLEX MICROSCOPIC
BILIRUBIN URINE: NEGATIVE
Glucose, UA: NEGATIVE mg/dL
HGB URINE DIPSTICK: NEGATIVE
Ketones, ur: NEGATIVE mg/dL
Leukocytes, UA: NEGATIVE
Nitrite: NEGATIVE
PROTEIN: NEGATIVE mg/dL
Specific Gravity, Urine: 1.02 (ref 1.005–1.030)
pH: 5.5 (ref 5.0–8.0)

## 2015-09-22 LAB — I-STAT CHEM 8, ED
BUN: 22 mg/dL — ABNORMAL HIGH (ref 6–20)
Calcium, Ion: 1.2 mmol/L (ref 1.12–1.23)
Chloride: 105 mmol/L (ref 101–111)
Creatinine, Ser: 1 mg/dL (ref 0.61–1.24)
Glucose, Bld: 96 mg/dL (ref 65–99)
HCT: 44 % (ref 39.0–52.0)
Hemoglobin: 15 g/dL (ref 13.0–17.0)
POTASSIUM: 4.2 mmol/L (ref 3.5–5.1)
SODIUM: 141 mmol/L (ref 135–145)
TCO2: 22 mmol/L (ref 0–100)

## 2015-09-22 LAB — PROTIME-INR
INR: 0.99 (ref 0.00–1.49)
Prothrombin Time: 13.3 seconds (ref 11.6–15.2)

## 2015-09-22 LAB — SAMPLE TO BLOOD BANK

## 2015-09-22 LAB — CDS SEROLOGY

## 2015-09-22 LAB — ETHANOL

## 2015-09-22 LAB — I-STAT CG4 LACTIC ACID, ED: LACTIC ACID, VENOUS: 1.52 mmol/L (ref 0.5–2.0)

## 2015-09-22 MED ORDER — IOPAMIDOL (ISOVUE-300) INJECTION 61%
INTRAVENOUS | Status: AC
  Administered 2015-09-22: 100 mL
  Filled 2015-09-22: qty 100

## 2015-09-22 MED ORDER — ONDANSETRON HCL 4 MG PO TABS: 4.0000 mg | ORAL_TABLET | Freq: Four times a day (QID) | ORAL | Status: DC | PRN

## 2015-09-22 MED ORDER — PANTOPRAZOLE SODIUM 40 MG PO TBEC
40.0000 mg | DELAYED_RELEASE_TABLET | Freq: Every day | ORAL | Status: DC
  Administered 2015-09-23: 40 mg via ORAL
  Filled 2015-09-22: qty 1

## 2015-09-22 MED ORDER — PANTOPRAZOLE SODIUM 40 MG IV SOLR: 40.0000 mg | Freq: Every day | INTRAVENOUS | Status: DC

## 2015-09-22 MED ORDER — MORPHINE SULFATE (PF) 10 MG/ML IV SOLN
10.0000 mg | Freq: Once | INTRAVENOUS | Status: AC
  Administered 2015-09-22: 10 mg via INTRAVENOUS
  Filled 2015-09-22: qty 1

## 2015-09-22 MED ORDER — ENOXAPARIN SODIUM 40 MG/0.4ML ~~LOC~~ SOLN
40.0000 mg | SUBCUTANEOUS | Status: DC
  Administered 2015-09-23 – 2015-09-26 (×4): 40 mg via SUBCUTANEOUS
  Filled 2015-09-22 (×4): qty 0.4

## 2015-09-22 MED ORDER — FENTANYL CITRATE (PF) 100 MCG/2ML IJ SOLN
100.0000 ug | Freq: Once | INTRAMUSCULAR | Status: AC
  Administered 2015-09-22: 100 ug via INTRAVENOUS
  Filled 2015-09-22: qty 2

## 2015-09-22 MED ORDER — POTASSIUM CHLORIDE IN NACL 20-0.9 MEQ/L-% IV SOLN
INTRAVENOUS | Status: DC
  Administered 2015-09-23: 15:00:00 via INTRAVENOUS
  Filled 2015-09-22 (×3): qty 1000

## 2015-09-22 MED ORDER — ACETAMINOPHEN 325 MG PO TABS
650.0000 mg | ORAL_TABLET | Freq: Four times a day (QID) | ORAL | Status: AC
  Administered 2015-09-23 – 2015-09-24 (×7): 650 mg via ORAL
  Filled 2015-09-22 (×7): qty 2

## 2015-09-22 MED ORDER — HYDROMORPHONE HCL 1 MG/ML IJ SOLN
1.0000 mg | INTRAMUSCULAR | Status: DC | PRN
  Filled 2015-09-22: qty 1

## 2015-09-22 MED ORDER — NICOTINE 14 MG/24HR TD PT24
14.0000 mg | MEDICATED_PATCH | TRANSDERMAL | Status: DC
  Administered 2015-09-22 – 2015-09-25 (×4): 14 mg via TRANSDERMAL
  Filled 2015-09-22 (×4): qty 1

## 2015-09-22 MED ORDER — HYDROMORPHONE HCL 1 MG/ML IJ SOLN
1.0000 mg | Freq: Once | INTRAMUSCULAR | Status: AC
  Administered 2015-09-22: 1 mg via INTRAVENOUS
  Filled 2015-09-22: qty 1

## 2015-09-22 MED ORDER — OXYCODONE HCL 5 MG PO TABS: 2.5000 mg | ORAL_TABLET | ORAL | Status: DC | PRN

## 2015-09-22 MED ORDER — ONDANSETRON HCL 4 MG/2ML IJ SOLN: 4.0000 mg | Freq: Four times a day (QID) | INTRAMUSCULAR | Status: DC | PRN

## 2015-09-22 MED ORDER — ONDANSETRON HCL 4 MG/2ML IJ SOLN
4.0000 mg | Freq: Once | INTRAMUSCULAR | Status: AC
  Administered 2015-09-22: 4 mg via INTRAVENOUS
  Filled 2015-09-22: qty 2

## 2015-09-22 MED ORDER — OXYCODONE HCL 5 MG PO TABS
10.0000 mg | ORAL_TABLET | ORAL | Status: DC | PRN
  Administered 2015-09-22 – 2015-09-24 (×6): 10 mg via ORAL
  Filled 2015-09-22 (×6): qty 2

## 2015-09-22 MED ORDER — METHOCARBAMOL 500 MG PO TABS
500.0000 mg | ORAL_TABLET | Freq: Four times a day (QID) | ORAL | Status: DC | PRN
  Administered 2015-09-23 – 2015-09-26 (×12): 500 mg via ORAL
  Filled 2015-09-22 (×12): qty 1

## 2015-09-22 MED ORDER — MORPHINE SULFATE (PF) 2 MG/ML IV SOLN
1.0000 mg | INTRAVENOUS | Status: DC | PRN
  Administered 2015-09-22 – 2015-09-23 (×4): 4 mg via INTRAVENOUS
  Administered 2015-09-23: 2 mg via INTRAVENOUS
  Administered 2015-09-23 – 2015-09-24 (×8): 4 mg via INTRAVENOUS
  Filled 2015-09-22 (×8): qty 2
  Filled 2015-09-22: qty 1
  Filled 2015-09-22 (×4): qty 2

## 2015-09-22 MED ORDER — OXYCODONE HCL 5 MG PO TABS
5.0000 mg | ORAL_TABLET | ORAL | Status: DC | PRN
Start: 2015-09-22 — End: 2015-09-24

## 2015-09-22 MED ORDER — DOCUSATE SODIUM 100 MG PO CAPS
100.0000 mg | ORAL_CAPSULE | Freq: Two times a day (BID) | ORAL | Status: DC
  Administered 2015-09-23 – 2015-09-24 (×4): 100 mg via ORAL
  Filled 2015-09-22 (×4): qty 1

## 2015-09-22 NOTE — ED Notes (Signed)
Patient transported to CT 

## 2015-09-22 NOTE — ED Notes (Signed)
PT bladder scanned at 246 ml
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
## 2015-09-22 NOTE — Progress Notes (Signed)
Orthopedic Tech Progress Note Patient Details:  [name] 05-08-1960 [id number]  Ortho Devices Type of Ortho Device: Sling immobilizer Ortho Device/Splint Location: RUE shoulder Ortho Device/Splint Interventions: Ordered, Application   Braulio Bosch 09/22/2015, 7:02 PM

## 2015-09-22 NOTE — ED Notes (Signed)
CT contacted and advised that patient has urinated and may return to CT.
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
## 2015-09-23 ENCOUNTER — Observation Stay (HOSPITAL_COMMUNITY): Payer: Medicare Other

## 2015-09-23 ENCOUNTER — Encounter (HOSPITAL_COMMUNITY): Payer: Self-pay | Admitting: General Practice

## 2015-09-23 DIAGNOSIS — S2231XA Fracture of one rib, right side, initial encounter for closed fracture: Secondary | ICD-10-CM | POA: Diagnosis not present

## 2015-09-23 DIAGNOSIS — S3991XA Unspecified injury of abdomen, initial encounter: Secondary | ICD-10-CM | POA: Diagnosis not present

## 2015-09-23 DIAGNOSIS — Z4682 Encounter for fitting and adjustment of non-vascular catheter: Secondary | ICD-10-CM | POA: Diagnosis not present

## 2015-09-23 DIAGNOSIS — S2241XA Multiple fractures of ribs, right side, initial encounter for closed fracture: Secondary | ICD-10-CM | POA: Diagnosis present

## 2015-09-23 DIAGNOSIS — F17210 Nicotine dependence, cigarettes, uncomplicated: Secondary | ICD-10-CM | POA: Diagnosis present

## 2015-09-23 DIAGNOSIS — Z79899 Other long term (current) drug therapy: Secondary | ICD-10-CM | POA: Diagnosis not present

## 2015-09-23 DIAGNOSIS — S270XXA Traumatic pneumothorax, initial encounter: Secondary | ICD-10-CM | POA: Diagnosis present

## 2015-09-23 DIAGNOSIS — Z885 Allergy status to narcotic agent status: Secondary | ICD-10-CM | POA: Diagnosis not present

## 2015-09-23 DIAGNOSIS — S42101A Fracture of unspecified part of scapula, right shoulder, initial encounter for closed fracture: Secondary | ICD-10-CM | POA: Diagnosis present

## 2015-09-23 DIAGNOSIS — N2889 Other specified disorders of kidney and ureter: Secondary | ICD-10-CM | POA: Diagnosis present

## 2015-09-23 DIAGNOSIS — M5412 Radiculopathy, cervical region: Secondary | ICD-10-CM | POA: Diagnosis present

## 2015-09-23 DIAGNOSIS — D62 Acute posthemorrhagic anemia: Secondary | ICD-10-CM | POA: Diagnosis present

## 2015-09-23 DIAGNOSIS — S42024A Nondisplaced fracture of shaft of right clavicle, initial encounter for closed fracture: Secondary | ICD-10-CM | POA: Diagnosis not present

## 2015-09-23 DIAGNOSIS — S42124A Nondisplaced fracture of acromial process, right shoulder, initial encounter for closed fracture: Secondary | ICD-10-CM | POA: Diagnosis not present

## 2015-09-23 DIAGNOSIS — H9193 Unspecified hearing loss, bilateral: Secondary | ICD-10-CM | POA: Diagnosis present

## 2015-09-23 DIAGNOSIS — S42031A Displaced fracture of lateral end of right clavicle, initial encounter for closed fracture: Secondary | ICD-10-CM | POA: Diagnosis present

## 2015-09-23 DIAGNOSIS — R339 Retention of urine, unspecified: Secondary | ICD-10-CM | POA: Diagnosis present

## 2015-09-23 DIAGNOSIS — J939 Pneumothorax, unspecified: Secondary | ICD-10-CM | POA: Diagnosis not present

## 2015-09-23 DIAGNOSIS — M4802 Spinal stenosis, cervical region: Secondary | ICD-10-CM | POA: Diagnosis present

## 2015-09-23 DIAGNOSIS — S42001A Fracture of unspecified part of right clavicle, initial encounter for closed fracture: Secondary | ICD-10-CM | POA: Diagnosis not present

## 2015-09-23 DIAGNOSIS — S272XXA Traumatic hemopneumothorax, initial encounter: Secondary | ICD-10-CM | POA: Diagnosis not present

## 2015-09-23 DIAGNOSIS — K219 Gastro-esophageal reflux disease without esophagitis: Secondary | ICD-10-CM | POA: Diagnosis present

## 2015-09-23 LAB — CBC
HEMATOCRIT: 39.5 % (ref 39.0–52.0)
HEMATOCRIT: 39.9 % (ref 39.0–52.0)
HEMOGLOBIN: 13.2 g/dL (ref 13.0–17.0)
HEMOGLOBIN: 13.8 g/dL (ref 13.0–17.0)
MCH: 28.4 pg (ref 26.0–34.0)
MCH: 29.4 pg (ref 26.0–34.0)
MCHC: 33.4 g/dL (ref 30.0–36.0)
MCHC: 34.6 g/dL (ref 30.0–36.0)
MCV: 84.9 fL (ref 78.0–100.0)
MCV: 85.1 fL (ref 78.0–100.0)
Platelets: 225 10*3/uL (ref 150–400)
Platelets: 228 10*3/uL (ref 150–400)
RBC: 4.65 MIL/uL (ref 4.22–5.81)
RBC: 4.69 MIL/uL (ref 4.22–5.81)
RDW: 12.6 % (ref 11.5–15.5)
RDW: 12.8 % (ref 11.5–15.5)
WBC: 6.8 10*3/uL (ref 4.0–10.5)
WBC: 7.4 10*3/uL (ref 4.0–10.5)

## 2015-09-23 LAB — BASIC METABOLIC PANEL
Anion gap: 7 (ref 5–15)
BUN: 12 mg/dL (ref 6–20)
CHLORIDE: 102 mmol/L (ref 101–111)
CO2: 27 mmol/L (ref 22–32)
CREATININE: 0.98 mg/dL (ref 0.61–1.24)
Calcium: 8.9 mg/dL (ref 8.9–10.3)
GFR calc Af Amer: >60 mL/min (ref >60)
GFR calc non Af Amer: >60 mL/min (ref >60)
Glucose, Bld: 110 mg/dL — ABNORMAL HIGH (ref 65–99)
Potassium: 3.7 mmol/L (ref 3.5–5.1)
Sodium: 136 mmol/L (ref 135–145)

## 2015-09-23 MED ORDER — LIDOCAINE HCL (PF) 1 % IJ SOLN
0.0000 mL | Freq: Once | INTRAMUSCULAR | Status: DC | PRN
  Filled 2015-09-23: qty 30

## 2015-09-23 MED ORDER — MIDAZOLAM HCL 2 MG/2ML IJ SOLN
INTRAMUSCULAR | Status: AC
  Administered 2015-09-23: 2 mg
  Filled 2015-09-23: qty 2

## 2015-09-23 MED ORDER — MIDAZOLAM HCL 2 MG/2ML IJ SOLN: 2.0000 mg | Freq: Once | INTRAMUSCULAR | Status: DC

## 2015-09-23 MED ORDER — MIDAZOLAM HCL 2 MG/2ML IJ SOLN
2.0000 mg | Freq: Once | INTRAMUSCULAR | Status: AC
  Administered 2015-09-23: 2 mg via INTRAVENOUS

## 2015-09-23 MED ORDER — MIDAZOLAM HCL 2 MG/2ML IJ SOLN
INTRAMUSCULAR | Status: AC
  Filled 2015-09-23: qty 2

## 2015-09-23 NOTE — Progress Notes (Signed)
Orthopedic Tech Progress Note Patient Details:  [name] 07-27-1960 [id number]  Ortho Devices Type of Ortho Device: Arm sling Ortho Device/Splint Location: replacement arm sling Ortho Device/Splint Interventions: Application   [place] Pink 09/23/2015, 11:12 AM
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
## 2015-09-23 NOTE — Progress Notes (Signed)
Patient had bright red blood while trying to have a BM. Eminia notifed. Orders received.

## 2015-09-23 NOTE — Progress Notes (Signed)
Patient complaining of bladder fullness and inability to urinate.  Bladder scan showed 589cc residual.  MD paged.
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
## 2015-09-24 ENCOUNTER — Inpatient Hospital Stay (HOSPITAL_COMMUNITY): Payer: Medicare Other

## 2015-09-24 DIAGNOSIS — D62 Acute posthemorrhagic anemia: Secondary | ICD-10-CM | POA: Diagnosis not present

## 2015-09-24 DIAGNOSIS — S2241XA Multiple fractures of ribs, right side, initial encounter for closed fracture: Secondary | ICD-10-CM | POA: Diagnosis present

## 2015-09-24 DIAGNOSIS — R338 Other retention of urine: Secondary | ICD-10-CM | POA: Diagnosis not present

## 2015-09-24 DIAGNOSIS — S272XXA Traumatic hemopneumothorax, initial encounter: Secondary | ICD-10-CM | POA: Diagnosis present

## 2015-09-24 LAB — CBC
HCT: 38 % — ABNORMAL LOW (ref 39.0–52.0)
HEMOGLOBIN: 12.7 g/dL — AB (ref 13.0–17.0)
MCH: 28.6 pg (ref 26.0–34.0)
MCHC: 33.4 g/dL (ref 30.0–36.0)
MCV: 85.6 fL (ref 78.0–100.0)
Platelets: 207 10*3/uL (ref 150–400)
RBC: 4.44 MIL/uL (ref 4.22–5.81)
RDW: 12.6 % (ref 11.5–15.5)
WBC: 8.4 10*3/uL (ref 4.0–10.5)

## 2015-09-24 MED ORDER — OXYCODONE HCL 5 MG PO TABS
10.0000 mg | ORAL_TABLET | ORAL | Status: DC | PRN
  Administered 2015-09-24: 20 mg via ORAL
  Administered 2015-09-24: 10 mg via ORAL
  Administered 2015-09-25 – 2015-09-26 (×7): 20 mg via ORAL
  Filled 2015-09-24 (×4): qty 4
  Filled 2015-09-24: qty 2
  Filled 2015-09-24 (×4): qty 4

## 2015-09-24 MED ORDER — BETHANECHOL CHLORIDE 25 MG PO TABS
25.0000 mg | ORAL_TABLET | Freq: Four times a day (QID) | ORAL | Status: DC
  Administered 2015-09-24 – 2015-09-26 (×10): 25 mg via ORAL
  Filled 2015-09-24 (×10): qty 1

## 2015-09-24 MED ORDER — TAMSULOSIN HCL 0.4 MG PO CAPS
0.4000 mg | ORAL_CAPSULE | Freq: Every day | ORAL | Status: DC
  Administered 2015-09-24 – 2015-09-26 (×3): 0.4 mg via ORAL
  Filled 2015-09-24 (×3): qty 1

## 2015-09-24 MED ORDER — TRAMADOL HCL 50 MG PO TABS
100.0000 mg | ORAL_TABLET | Freq: Four times a day (QID) | ORAL | Status: DC
  Administered 2015-09-24 – 2015-09-26 (×9): 100 mg via ORAL
  Filled 2015-09-24 (×9): qty 2

## 2015-09-24 MED ORDER — POLYETHYLENE GLYCOL 3350 17 G PO PACK
17.0000 g | PACK | Freq: Every day | ORAL | Status: DC
  Administered 2015-09-24: 17 g via ORAL
  Filled 2015-09-24: qty 1

## 2015-09-24 NOTE — Progress Notes (Signed)
Patient ID: [name], male   DOB: [birth year], 55 y.o.   MRN: [id number]   LOS: 1 day   Subjective: C/o pain   Objective: Vital signs in last 24 hours: Temp:  [98.8 F (37.1 C)-99.4 F (37.4 C)] 99.4 F (37.4 C) (05/30 0525) Pulse Rate:  [57-73] 73 (05/30 0525) Resp:  [12-18] 12 (05/29 1755) BP: (115-149)/(74-101) 145/88 mmHg (05/30 0525) SpO2:  [97 %-100 %] 99 % (05/30 0525) Weight:  [81.647 kg (180 lb)] 81.647 kg (180 lb) (05/29 1628)    IS: 1548ml   CT No air leak 63ml/24h @110ml    Laboratory  CBC  Recent Labs  09/23/15 1043 09/24/15 0442  WBC 7.4 8.4  HGB 13.8 12.7*  HCT 39.9 38.0*  PLT 228 207    Physical Exam General appearance: alert and no distress Resp: clear to auscultation bilaterally Cardio: regular rate and rhythm GI: normal findings: bowel sounds normal and soft, non-tender   Assessment/Plan: BCC Right clav/scap fxs -- per Dr. Percell Miller, scap non-op, clav plan pending Mutliple right rib fxs w/HPTX s/p CT -- CXR today, to water seal if ok Right renal mass -- OP f/u with urology ABL anemia -- Mild Urinary retention -- Start Flomax and urecholine FEN -- Increase OxyIR, add tramadol VTE -- SCD's, Lovenox Dispo -- CT    Lisette Abu, PA-C Pager: [phone] General Trauma PA Pager: [phone]  09/24/2015

## 2015-09-24 NOTE — Progress Notes (Signed)
I reviewed his x-rays we will go forward with nonoperative treatment of his right scapula as well as his right clavicle  Plan Sling full time when out of bed he may be out of sling when in bed  Follow-up in my office in 1-2 weeks for repeat x-rays  Luian Schumpert D

## 2015-09-25 ENCOUNTER — Inpatient Hospital Stay (HOSPITAL_COMMUNITY): Payer: Medicare Other

## 2015-09-25 MED ORDER — DIPHENHYDRAMINE HCL 25 MG PO CAPS: 25.0000 mg | ORAL_CAPSULE | Freq: Four times a day (QID) | ORAL | Status: DC | PRN

## 2015-09-25 MED ORDER — POLYETHYLENE GLYCOL 3350 17 G PO PACK
17.0000 g | PACK | Freq: Two times a day (BID) | ORAL | Status: DC
  Administered 2015-09-25 – 2015-09-26 (×3): 17 g via ORAL
  Filled 2015-09-25 (×3): qty 1

## 2015-09-25 MED ORDER — MORPHINE SULFATE (PF) 2 MG/ML IV SOLN
2.0000 mg | INTRAVENOUS | Status: DC | PRN
  Administered 2015-09-25 – 2015-09-26 (×3): 2 mg via INTRAVENOUS
  Filled 2015-09-25 (×3): qty 1

## 2015-09-25 MED ORDER — FAMOTIDINE 10 MG PO TABS
10.0000 mg | ORAL_TABLET | Freq: Two times a day (BID) | ORAL | Status: DC
  Administered 2015-09-25 – 2015-09-26 (×3): 10 mg via ORAL
  Filled 2015-09-25 (×3): qty 1

## 2015-09-25 MED ORDER — DOCUSATE SODIUM 100 MG PO CAPS
200.0000 mg | ORAL_CAPSULE | Freq: Two times a day (BID) | ORAL | Status: DC
  Administered 2015-09-25 – 2015-09-26 (×3): 200 mg via ORAL
  Filled 2015-09-25 (×3): qty 2

## 2015-09-25 NOTE — Clinical Social Work Note (Signed)
CSW attempted to complete assessment with the patient. Interpretive services not at bedside for the rest of the day. CSW called [employer] [facility] [employer]. They state they will be available between 8-10AM tomorrow. CSW will plan to meet with the patient tomorrow morning.  Liz Beach MSW, [place], [place], [phone]

## 2015-09-25 NOTE — Progress Notes (Signed)
Pt refused in and out cath.  MD on-call made aware.  Will continue to monitor.

## 2015-09-25 NOTE — Care Management Important Message (Signed)
Important Message  Patient Details  Name: [name] MRN: [id number] Date of Birth: May 10, 1960   Medicare Important Message Given:  Yes    Loann Quill 09/25/2015, 9:30 AM

## 2015-09-25 NOTE — Progress Notes (Signed)
Patient ID: [name], male   DOB: 1960/09/20, 55 y.o.   MRN: [id number]   LOS: 2 days   Subjective: Frustrated but [name]. Does not think he'll have problems at discharge, contrary to what I heard yesterday.   Objective: Vital signs in last 24 hours: Temp:  [98.4 F (36.9 C)-98.9 F (37.2 C)] 98.9 F (37.2 C) (05/31 0556) Pulse Rate:  [71-83] 81 (05/31 0556) Resp:  [14-16] 14 (05/30 1341) BP: (141-152)/(89-99) 147/99 mmHg (05/31 0556) SpO2:  [94 %-99 %] 99 % (05/31 0556) Last BM Date: 09/22/15   IS: 1528ml (=)   CT No air leak 157ml/24h @300ml    Radiology Results PORTABLE CHEST 1 VIEW  COMPARISON: 09/24/2015 . 09/23/2015.  FINDINGS: Right chest tube in stable position. Continued improvement of right pneumothorax. Mild bibasilar atelectasis. Heart size stable. Right rib fractures again noted.  IMPRESSION: 1. Right chest tube in stable position. Interim continued resolution of right pneumothorax with mild residual. Right rib fractures again noted. 2. Mild bibasilar subsegmental atelectasis.   Electronically Signed  By: Marcello Moores Register  On: 09/25/2015 08:10   Physical Exam General appearance: alert and no distress Resp: rubs bilaterally Cardio: regular rate and rhythm GI: normal findings: bowel sounds normal and soft, non-tender   Assessment/Plan: BCC Right clav/scap fxs -- per Dr. Percell Miller, scap/clav non-op Mutliple right rib fxs w/HPTX s/p CT -- CXR to water seal  Right renal mass -- OP f/u with urology ABL anemia -- Mild Urinary retention -- Flomax and urecholine, voiding trial today FEN -- Add Benadryl and increase stool softener VTE -- SCD's, Lovenox Dispo -- CT    Lisette Abu, PA-C Pager: [phone] General Trauma PA Pager: [phone]  09/25/2015
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
## 2015-09-26 ENCOUNTER — Inpatient Hospital Stay (HOSPITAL_COMMUNITY): Payer: Medicare Other

## 2015-09-26 MED ORDER — TRAMADOL HCL 50 MG PO TABS: 100.0000 mg | ORAL_TABLET | Freq: Four times a day (QID) | ORAL | Status: DC

## 2015-09-26 MED ORDER — METHOCARBAMOL 500 MG PO TABS: 1000.0000 mg | ORAL_TABLET | Freq: Four times a day (QID) | ORAL | Status: DC | PRN

## 2015-09-26 MED ORDER — NICOTINE 14 MG/24HR TD PT24: 14.0000 mg | MEDICATED_PATCH | TRANSDERMAL | Status: DC

## 2015-09-26 MED ORDER — OXYCODONE-ACETAMINOPHEN 10-325 MG PO TABS: 1.0000 | ORAL_TABLET | ORAL | Status: DC | PRN

## 2015-09-26 NOTE — Progress Notes (Signed)
PT Cancellation Note  Patient Details Name: [name] MRN: [id number] DOB: 06-03-1960   Cancelled Treatment:    Reason Eval/Treat Not Completed: Patient declined, no reason specified.  Pt declining PT and mobility at this time and seems frustrated by the situation.  Per interpreter pt has been more frustrated this morning than previous 2 days.  Will f/u another time.     Khalise Billard, Thornton Papas 09/26/2015, 8:49 AM
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
## 2015-09-26 NOTE — Progress Notes (Addendum)
All discharge papers gone over with pt. We communicated through writing. Pt. Writes that he understands everything and has no questions/complaints. Prescriptions were given to pt. And he writes that he understands how to get them filled. IV taken out. Taxi service called. Pt. D/c'd successfully via w/c.

## 2015-09-26 NOTE — Progress Notes (Signed)
LOS: 3 days   Subjective: Wants to go home.  Pain on the right side, feels a popping sensation. Voiding without problems. Denies any dysuria.  Objective: Vital signs in last 24 hours: Temp:  [98.2 F (36.8 C)-98.8 F (37.1 C)] 98.2 F (36.8 C) (06/01 0529) Pulse Rate:  [69-87] 76 (06/01 0529) Resp:  [15-16] 15 (06/01 0529) BP: (129-155)/(91-98) 137/91 mmHg (06/01 0529) SpO2:  [93 %-98 %] 93 % (06/01 0529) Last BM Date: 09/22/15   IS: 1730ml (=)   CT No air leak 120 ml/24hr @420  ml  Radiology Results PORTABLE CHEST 1 VIEW  COMPARISON: Portable chest x-ray of 09/25/2015 and 09/24/2015  FINDINGS: There is still a tiny right apical pneumothorax present with right chest tube noted. Mild linear atelectasis remains at the right lung base. The left lung is clear. Heart size is stable. Comminuted fracture of the right mid distal clavicle is again noted.  IMPRESSION: Tiny right apical pneumothorax remains with right chest tube in place. Comminuted fracture of the right mid distal clavicle.  Electronically Signed  By: Ivar Drape M.D.  On: 09/26/2015 08:12  Laboratory  CBC  Recent Labs  09/23/15 1043 09/24/15 0442  WBC 7.4 8.4  HGB 13.8 12.7*  HCT 39.9 38.0*  PLT 228 207   BMET No results for input(s): NA, K, CL, CO2, GLUCOSE, BUN, CREATININE, CALCIUM in the last 72 hours.   Physical Exam General: alert and cooperative, no distress Resp: CTAB Cardio: RRR, s1, s2, no M/G/R GI: soft, nontender, non-distended, normal BS  Assessment/Plan: BCC Right clav/scap fxs -- per Dr. Percell Miller, scap/clav non-op Mutliple right rib fxs w/HPTX s/p CT -- very small right PTX on CXR, improved from yesterday. Removed CT. Right renal mass -- OP f/u with urology ABL anemia -- Mild FEN -- Pepcid added for heartburn yesterday, improved today VTE -- SCD's, Lovenox Dispo -- Repeat CXR at 1100, possible discharge if CXR looks clear.  Adam Ferrelli [place] PA-S  09/26/2015

## 2015-09-26 NOTE — Clinical Social Work Note (Signed)
SCAT application provided to the patient along with instructions for completion.   Liz Beach MSW, [place], [place], [id number]

## 2015-09-26 NOTE — Progress Notes (Signed)
OT Cancellation Note  Patient Details Name: [name] MRN: [id number] DOB: 26-May-1960   Cancelled Treatment:    Reason Eval/Treat Not Completed: Other (comment) (PT just attempted working with pt; he declined.) Per interpreter; pt seems more frustrated this AM. Will follow up tomorrow for OT treatment.  Binnie Kand M.S., OTR/L Pager: [phone]  09/26/2015, 8:59 AM

## 2015-09-26 NOTE — Discharge Instructions (Signed)
Remove chest dressing on Saturday, then Wash wounds daily in shower with soap and water. Do not soak. Apply antibiotic ointment (e.g. Neosporin) twice daily and as needed to keep moist. Cover with dry dressing.  No lifting, pushing, pulling with right arm. Use sling when out of bed.  No driving while taking oxycodone.
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
## 2015-09-26 NOTE — Clinical Social Work Note (Signed)
Taxi voucher provided. Liz Beach MSW, [place], [place], [id number]

## 2015-09-30 ENCOUNTER — Emergency Department (HOSPITAL_COMMUNITY)
Admission: EM | Admit: 2015-09-30 | Discharge: 2015-09-30 | Disposition: A | Discharge status: Home / Self Care | Payer: Medicare Other | Attending: Emergency Medicine | Admitting: Emergency Medicine

## 2015-09-30 DIAGNOSIS — Y929 Unspecified place or not applicable: Secondary | ICD-10-CM | POA: Diagnosis not present

## 2015-09-30 DIAGNOSIS — F17210 Nicotine dependence, cigarettes, uncomplicated: Secondary | ICD-10-CM | POA: Diagnosis not present

## 2015-09-30 DIAGNOSIS — S4991XA Unspecified injury of right shoulder and upper arm, initial encounter: Secondary | ICD-10-CM | POA: Diagnosis present

## 2015-09-30 DIAGNOSIS — Z79899 Other long term (current) drug therapy: Secondary | ICD-10-CM | POA: Diagnosis not present

## 2015-09-30 DIAGNOSIS — Y939 Activity, unspecified: Secondary | ICD-10-CM | POA: Diagnosis not present

## 2015-09-30 DIAGNOSIS — S42001A Fracture of unspecified part of right clavicle, initial encounter for closed fracture: Secondary | ICD-10-CM | POA: Diagnosis not present

## 2015-09-30 DIAGNOSIS — Y999 Unspecified external cause status: Secondary | ICD-10-CM | POA: Diagnosis not present

## 2015-09-30 DIAGNOSIS — F329 Major depressive disorder, single episode, unspecified: Secondary | ICD-10-CM | POA: Insufficient documentation

## 2015-09-30 MED ORDER — IBUPROFEN 800 MG PO TABS
800.0000 mg | ORAL_TABLET | Freq: Once | ORAL | Status: AC
  Administered 2015-09-30: 800 mg via ORAL
  Filled 2015-09-30: qty 1

## 2015-09-30 MED ORDER — ACETAMINOPHEN 500 MG PO TABS
1000.0000 mg | ORAL_TABLET | Freq: Once | ORAL | Status: AC
  Administered 2015-09-30: 1000 mg via ORAL
  Filled 2015-09-30: qty 2

## 2015-09-30 MED ORDER — OXYCODONE HCL 5 MG PO TABS: 5.0000 mg | ORAL_TABLET | ORAL | Status: DC | PRN

## 2015-09-30 MED ORDER — OXYCODONE HCL 5 MG PO TABS
5.0000 mg | ORAL_TABLET | Freq: Once | ORAL | Status: AC
  Administered 2015-09-30: 5 mg via ORAL
  Filled 2015-09-30: qty 1
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
## 2015-09-30 NOTE — ED Notes (Signed)
Case management to room
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
## 2015-09-30 NOTE — ED Notes (Signed)
Cm @ [facility]

## 2015-09-30 NOTE — ED Notes (Signed)
Spoke to sw who will respond to bedside. Pt requested I call his sister.

## 2015-09-30 NOTE — ED Notes (Signed)
Social work to room
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
## 2015-09-30 NOTE — ED Notes (Signed)
MD at bedside. 
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
## 2015-09-30 NOTE — ED Notes (Signed)
Bus voucher given.

## 2015-09-30 NOTE — ED Notes (Signed)
Patient requests in person interpreter. Says that 'this is ADA law' and he 'sue [place]' if we don't give him one.

## 2015-09-30 NOTE — ED Notes (Signed)
Interpreter Selinda Flavin to room

## 2015-09-30 NOTE — ED Notes (Signed)
Ice applied to patient's right shoulder.

## 2015-10-01 ENCOUNTER — Telehealth: Payer: Self-pay

## 2015-10-01 NOTE — Telephone Encounter (Signed)
Message received from Livia Snellen, RN CM requesting a hospital follow up appointment for the patient at [facility]. Call placed to the patient to hearing impaired video phone # [phone]. Interpreter # [phone] assisted with the call. Explained to the patient that there are not any appointments available at [facility] for hospital follow up at this time. CM to contact him tomorrow to provide an update on appointment status. Also provided the patient with the [facility] # [phone] and informed him that he can call  with questions/concerns that he might have. The interpreter stated that he would receive assistance from an interpreter if he chose to make a call.  He inquired about home care services and housekeeping assistance. This CM explained the difference between the home health services - RN, PT, SW and home health aide and noted that the services would be short term if he qualifies. Explained that the home health aide's role is to assist with personal care as the primary function and light housekeeping may be done if the time is available. Also explained that he needs to see the provider before a referral for PCS can be made.He would then need to be assessed for the program. He said that his medication was making him groggy and he noted that he did not have any more questions/concerns at this time.   Update provided to A. Christen Bame, RN CM

## 2015-10-02 ENCOUNTER — Telehealth: Payer: Self-pay

## 2015-10-02 DIAGNOSIS — M479 Spondylosis, unspecified: Secondary | ICD-10-CM | POA: Diagnosis not present

## 2015-10-02 DIAGNOSIS — S42001D Fracture of unspecified part of right clavicle, subsequent encounter for fracture with routine healing: Secondary | ICD-10-CM | POA: Diagnosis not present

## 2015-10-02 DIAGNOSIS — F17210 Nicotine dependence, cigarettes, uncomplicated: Secondary | ICD-10-CM | POA: Diagnosis not present

## 2015-10-02 DIAGNOSIS — M4802 Spinal stenosis, cervical region: Secondary | ICD-10-CM | POA: Diagnosis not present

## 2015-10-02 DIAGNOSIS — M1990 Unspecified osteoarthritis, unspecified site: Secondary | ICD-10-CM | POA: Diagnosis not present

## 2015-10-02 DIAGNOSIS — F329 Major depressive disorder, single episode, unspecified: Secondary | ICD-10-CM | POA: Diagnosis not present

## 2015-10-02 NOTE — Telephone Encounter (Signed)
Call placed to patient to hearing impaired video phone [phone], Interpreter# [phone]) to discuss scheduling appointment at [place]. Appointment scheduled for 10/03/15 at 1200. Patient informed of location at [place]. Patient appreciative of appointment. Patient indicated he would schedule transportation to his appointment. No additional needs identified.

## 2015-10-03 ENCOUNTER — Encounter: Payer: Self-pay | Admitting: Family Medicine

## 2015-10-03 ENCOUNTER — Ambulatory Visit: Payer: Medicare Other | Attending: Family Medicine | Admitting: Family Medicine

## 2015-10-03 ENCOUNTER — Telehealth: Payer: Self-pay

## 2015-10-03 VITALS — BP 153/102 | HR 84 | Temp 97.4°F | Ht 71.0 in | Wt 176.4 lb

## 2015-10-03 DIAGNOSIS — IMO0001 Reserved for inherently not codable concepts without codable children: Secondary | ICD-10-CM

## 2015-10-03 DIAGNOSIS — R030 Elevated blood-pressure reading, without diagnosis of hypertension: Secondary | ICD-10-CM

## 2015-10-03 DIAGNOSIS — S2241XA Multiple fractures of ribs, right side, initial encounter for closed fracture: Secondary | ICD-10-CM

## 2015-10-03 DIAGNOSIS — S42101A Fracture of unspecified part of scapula, right shoulder, initial encounter for closed fracture: Secondary | ICD-10-CM | POA: Diagnosis not present

## 2015-10-03 DIAGNOSIS — N2889 Other specified disorders of kidney and ureter: Secondary | ICD-10-CM | POA: Diagnosis not present

## 2015-10-03 DIAGNOSIS — R399 Unspecified symptoms and signs involving the genitourinary system: Secondary | ICD-10-CM

## 2015-10-03 DIAGNOSIS — S42001A Fracture of unspecified part of right clavicle, initial encounter for closed fracture: Secondary | ICD-10-CM | POA: Diagnosis not present

## 2015-10-03 DIAGNOSIS — F17200 Nicotine dependence, unspecified, uncomplicated: Secondary | ICD-10-CM

## 2015-10-03 MED ORDER — TAMSULOSIN HCL 0.4 MG PO CAPS: 0.4000 mg | ORAL_CAPSULE | Freq: Every day | ORAL | Status: DC

## 2015-10-03 NOTE — Telephone Encounter (Signed)
ERROR

## 2015-10-03 NOTE — Patient Instructions (Signed)
Benign Prostatic Hyperplasia An enlarged prostate (benign prostatic hyperplasia) is common in older men. You may experience the following:  Weak urine stream.  Dribbling.  Feeling like the bladder has not emptied completely.  Difficulty starting urination.  Getting up frequently at night to urinate.  Urinating more frequently during the day. HOME CARE INSTRUCTIONS  Monitor your prostatic hyperplasia for any changes. The following actions may help to alleviate any discomfort you are experiencing:  Give yourself time when you urinate.  Stay away from alcohol.  Avoid beverages containing caffeine, such as coffee, tea, and colas, because they can make the problem worse.  Avoid decongestants, antihistamines, and some prescription medicines that can make the problem worse.  Follow up with your health care provider for further treatment as recommended. SEEK MEDICAL CARE IF:  You are experiencing progressive difficulty voiding.  Your urine stream is progressively getting narrower.  You are awaking from sleep with the urge to void more frequently.  You are constantly feeling the need to void.  You experience loss of urine, especially in small amounts. SEEK IMMEDIATE MEDICAL CARE IF:   You develop increased pain with urination or are unable to urinate.  You develop severe abdominal pain, vomiting, a high fever, or fainting.  You develop back pain or blood in your urine. MAKE SURE YOU:   Understand these instructions.  Will watch your condition.  Will get help right away if you are not doing well or get worse.   This information is not intended to replace advice given to you by your health care provider. Make sure you discuss any questions you have with your health care provider.   Document Released: 04/13/2005 Document Revised: 05/04/2014 Document Reviewed: 09/13/2012 Elsevier Interactive Patient Education 2016 Elsevier Inc.  
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
## 2015-10-04 ENCOUNTER — Telehealth: Payer: Self-pay | Admitting: Family Medicine

## 2015-10-04 NOTE — Telephone Encounter (Signed)
[name] form [facility] called requesting VO for home health visit.  Please f/u

## 2015-10-07 ENCOUNTER — Telehealth: Payer: Self-pay | Admitting: Family Medicine

## 2015-10-07 ENCOUNTER — Telehealth: Payer: Self-pay

## 2015-10-07 ENCOUNTER — Ambulatory Visit: Payer: Medicare Other

## 2015-10-07 NOTE — Telephone Encounter (Signed)
Call received from the patient with the assistance of [place] interpreter # [phone]. Explained to the patient that his medicaid is not active and he needs to contact DSS to inquire about his eligibility. Until the eligibility is confirmed, an application for [facility] services can't be submitted.   He stated that he would contact DSS in the morning and contact this CM with the outcome of the call.

## 2015-10-07 NOTE — Telephone Encounter (Signed)
Called patient back who is presently looking for services for cooking and cleaning.  Opal Sidles, RN case manager is looking into verifying his medicaid.

## 2015-10-07 NOTE — Telephone Encounter (Signed)
Patient called  States [facility] ph:[phone] needs approval for patient to have home services. Please f/up

## 2015-10-07 NOTE — Telephone Encounter (Signed)
Writer spoke with Amy and gave her a verbal order for PT to aide with strengthening and pain management teaching.

## 2015-10-07 NOTE — Telephone Encounter (Signed)
Call received from the patient requesting assistance with cooking.  Discussed his needs with Raina Mina,  [facility] RN  [place], [place] scheduler verified the status of the patient's medicaid # [id number] O in [place]  and it was noted that his medicaid is not active this month.  Attempted to contact the patient to inform him that he needs to contact DSS to clarify his medicaid status before a referral can be made for [facility] services. .  Call placed to the patient  # [phone] (M) with the assistance of [employer] intepreter # [phone]. Message left requesting a call back to # [phone].

## 2015-10-09 DIAGNOSIS — M479 Spondylosis, unspecified: Secondary | ICD-10-CM | POA: Diagnosis not present

## 2015-10-09 DIAGNOSIS — S42024D Nondisplaced fracture of shaft of right clavicle, subsequent encounter for fracture with routine healing: Secondary | ICD-10-CM | POA: Diagnosis not present

## 2015-10-09 DIAGNOSIS — S42124D Nondisplaced fracture of acromial process, right shoulder, subsequent encounter for fracture with routine healing: Secondary | ICD-10-CM | POA: Diagnosis not present

## 2015-10-09 DIAGNOSIS — S42001D Fracture of unspecified part of right clavicle, subsequent encounter for fracture with routine healing: Secondary | ICD-10-CM | POA: Diagnosis not present

## 2015-10-09 DIAGNOSIS — M4802 Spinal stenosis, cervical region: Secondary | ICD-10-CM | POA: Diagnosis not present

## 2015-10-09 DIAGNOSIS — F329 Major depressive disorder, single episode, unspecified: Secondary | ICD-10-CM | POA: Diagnosis not present

## 2015-10-09 DIAGNOSIS — M1990 Unspecified osteoarthritis, unspecified site: Secondary | ICD-10-CM | POA: Diagnosis not present

## 2015-10-10 ENCOUNTER — Telehealth: Payer: Self-pay | Admitting: Family Medicine

## 2015-10-10 ENCOUNTER — Telehealth: Payer: Self-pay

## 2015-10-10 DIAGNOSIS — F329 Major depressive disorder, single episode, unspecified: Secondary | ICD-10-CM | POA: Diagnosis not present

## 2015-10-10 DIAGNOSIS — N2889 Other specified disorders of kidney and ureter: Secondary | ICD-10-CM

## 2015-10-10 DIAGNOSIS — M479 Spondylosis, unspecified: Secondary | ICD-10-CM | POA: Diagnosis not present

## 2015-10-10 DIAGNOSIS — M1990 Unspecified osteoarthritis, unspecified site: Secondary | ICD-10-CM | POA: Diagnosis not present

## 2015-10-10 DIAGNOSIS — M4802 Spinal stenosis, cervical region: Secondary | ICD-10-CM | POA: Diagnosis not present

## 2015-10-10 DIAGNOSIS — S42001D Fracture of unspecified part of right clavicle, subsequent encounter for fracture with routine healing: Secondary | ICD-10-CM | POA: Diagnosis not present

## 2015-10-10 NOTE — Telephone Encounter (Signed)
Patient needs referral to [facility]. Please follow up.

## 2015-10-10 NOTE — Telephone Encounter (Signed)
Writer spoke with patient through an interpreter.  He stated that he was told in the hospital that there was a "right sided renal mass".  He at times has problems urinating, the color of his urine changes from red to brown and it is very cloudy.  Patient denies any pain with urinating and believes he should have a referral placed to a nephrologist.

## 2015-10-10 NOTE — Telephone Encounter (Signed)
Call received from [facility] requesting verbal order for Social Worker to assist with patient with needed community resources. Verbal order given for [facility] Social Worker. No additional needs identified.

## 2015-10-10 NOTE — Telephone Encounter (Signed)
What kind of kidney specialist? Urology or Nephrology?

## 2015-10-10 NOTE — Telephone Encounter (Signed)
Writer called back Horine from [facility] and gave a verbal order for OT for patient.

## 2015-10-10 NOTE — Telephone Encounter (Signed)
Sharyn Lull called to get verbal orders to continue giving OT to the pt.  Please f/u

## 2015-10-11 NOTE — Telephone Encounter (Signed)
Using [employer] sign language interpreters # [phone] a message was left letting patient know that [facility] was trying to get a hold of him.

## 2015-10-11 NOTE — Addendum Note (Signed)
Addended by: Arnoldo Morale on: 10/11/2015 01:37 PM   Modules accepted: Orders

## 2015-10-11 NOTE — Telephone Encounter (Signed)
He had informed me at his visit that he had an appointment with [facility] on 10/07/15. I have placed a referral to urology for his renal mass.

## 2015-10-12 DIAGNOSIS — M479 Spondylosis, unspecified: Secondary | ICD-10-CM | POA: Diagnosis not present

## 2015-10-12 DIAGNOSIS — S42001D Fracture of unspecified part of right clavicle, subsequent encounter for fracture with routine healing: Secondary | ICD-10-CM | POA: Diagnosis not present

## 2015-10-12 DIAGNOSIS — F329 Major depressive disorder, single episode, unspecified: Secondary | ICD-10-CM | POA: Diagnosis not present

## 2015-10-12 DIAGNOSIS — M4802 Spinal stenosis, cervical region: Secondary | ICD-10-CM | POA: Diagnosis not present

## 2015-10-12 DIAGNOSIS — M1990 Unspecified osteoarthritis, unspecified site: Secondary | ICD-10-CM | POA: Diagnosis not present

## 2015-10-14 ENCOUNTER — Telehealth: Payer: Self-pay | Admitting: Family Medicine

## 2015-10-14 NOTE — Telephone Encounter (Signed)
Pt. Returned nurses call. Please f/u with pt.  °

## 2015-10-15 DIAGNOSIS — S42001D Fracture of unspecified part of right clavicle, subsequent encounter for fracture with routine healing: Secondary | ICD-10-CM | POA: Diagnosis not present

## 2015-10-15 DIAGNOSIS — M4802 Spinal stenosis, cervical region: Secondary | ICD-10-CM | POA: Diagnosis not present

## 2015-10-15 DIAGNOSIS — F329 Major depressive disorder, single episode, unspecified: Secondary | ICD-10-CM | POA: Diagnosis not present

## 2015-10-15 DIAGNOSIS — M479 Spondylosis, unspecified: Secondary | ICD-10-CM | POA: Diagnosis not present

## 2015-10-15 DIAGNOSIS — M1990 Unspecified osteoarthritis, unspecified site: Secondary | ICD-10-CM | POA: Diagnosis not present

## 2015-10-16 DIAGNOSIS — M4802 Spinal stenosis, cervical region: Secondary | ICD-10-CM | POA: Diagnosis not present

## 2015-10-16 DIAGNOSIS — F329 Major depressive disorder, single episode, unspecified: Secondary | ICD-10-CM | POA: Diagnosis not present

## 2015-10-16 DIAGNOSIS — S42001D Fracture of unspecified part of right clavicle, subsequent encounter for fracture with routine healing: Secondary | ICD-10-CM | POA: Diagnosis not present

## 2015-10-16 DIAGNOSIS — M1990 Unspecified osteoarthritis, unspecified site: Secondary | ICD-10-CM | POA: Diagnosis not present

## 2015-10-16 DIAGNOSIS — M479 Spondylosis, unspecified: Secondary | ICD-10-CM | POA: Diagnosis not present

## 2015-10-16 NOTE — Telephone Encounter (Signed)
Sharyn Lull from [facility] called to let pt. PCP know that pt. Has achieved all his OT. Sharyn Lull would like to discharge pt. Form home health OT.

## 2015-10-19 ENCOUNTER — Telehealth: Payer: Self-pay | Admitting: Family Medicine

## 2015-10-19 NOTE — Telephone Encounter (Signed)
[name] from [place] orders for 2 visit for medial social worker To get aid to get someone to clean up and transportation for client Can leave detail

## 2015-10-21 NOTE — Telephone Encounter (Addendum)
Please see my documentation below; okay to proceed with requested orders. Thanks

## 2015-10-21 NOTE — Telephone Encounter (Signed)
tammy can you help with this pt.  This is a call i received on Saturday.  I'm not for sure who the medical assistance is. Thanks

## 2015-10-21 NOTE — Telephone Encounter (Signed)
Okay to proceed.  

## 2015-10-22 ENCOUNTER — Telehealth: Payer: Self-pay | Admitting: Family Medicine

## 2015-10-22 NOTE — Telephone Encounter (Signed)
Left chris a vm with ok

## 2015-10-22 NOTE — Telephone Encounter (Signed)
Barbara from [facility] assisted living called requesting verbal order for discharge this week instead of next. Ph:[phone] if unavailable would like order left on vm. Please f/up

## 2015-10-24 NOTE — Telephone Encounter (Signed)
Writer called and gave verbal order.

## 2015-10-30 ENCOUNTER — Ambulatory Visit: Payer: Medicaid Other | Attending: Family Medicine | Admitting: Family Medicine

## 2015-10-30 ENCOUNTER — Other Ambulatory Visit: Payer: Self-pay | Admitting: Family Medicine

## 2015-10-30 ENCOUNTER — Encounter: Payer: Self-pay | Admitting: Family Medicine

## 2015-10-30 VITALS — BP 144/98 | HR 78 | Temp 98.3°F | Resp 18 | Ht 72.0 in | Wt 175.6 lb

## 2015-10-30 DIAGNOSIS — I10 Essential (primary) hypertension: Secondary | ICD-10-CM

## 2015-10-30 DIAGNOSIS — S42101A Fracture of unspecified part of scapula, right shoulder, initial encounter for closed fracture: Secondary | ICD-10-CM | POA: Diagnosis not present

## 2015-10-30 DIAGNOSIS — H918X9 Other specified hearing loss, unspecified ear: Secondary | ICD-10-CM | POA: Insufficient documentation

## 2015-10-30 DIAGNOSIS — S42001A Fracture of unspecified part of right clavicle, initial encounter for closed fracture: Secondary | ICD-10-CM | POA: Insufficient documentation

## 2015-10-30 DIAGNOSIS — S2241XA Multiple fractures of ribs, right side, initial encounter for closed fracture: Secondary | ICD-10-CM | POA: Insufficient documentation

## 2015-10-30 DIAGNOSIS — N2889 Other specified disorders of kidney and ureter: Secondary | ICD-10-CM | POA: Diagnosis not present

## 2015-10-30 DIAGNOSIS — X58XXXA Exposure to other specified factors, initial encounter: Secondary | ICD-10-CM | POA: Diagnosis not present

## 2015-10-30 MED ORDER — LISINOPRIL 5 MG PO TABS: 5.0000 mg | ORAL_TABLET | Freq: Every day | ORAL | Status: DC

## 2015-10-30 MED FILL — ?LISINOPRIL 5 MG TABLET: 5 | 30 days supply | Qty: 30 | Fill #0

## 2015-10-30 NOTE — Progress Notes (Signed)
Medication refills- pain medication Has no insurance right now Would like medicinal marijuana

## 2015-10-30 NOTE — Patient Instructions (Signed)
Hypertension Hypertension, commonly called high blood pressure, is when the force of blood pumping through your arteries is too strong. Your arteries are the blood vessels that carry blood from your heart throughout your body. A blood pressure reading consists of a higher number over a lower number, such as 110/72. The higher number (systolic) is the pressure inside your arteries when your heart pumps. The lower number (diastolic) is the pressure inside your arteries when your heart relaxes. Ideally you want your blood pressure below 120/80. Hypertension forces your heart to work harder to pump blood. Your arteries may become narrow or stiff. Having untreated or uncontrolled hypertension can cause heart attack, stroke, kidney disease, and other problems. RISK FACTORS Some risk factors for high blood pressure are controllable. Others are not.  Risk factors you cannot control include:   Race. You may be at higher risk if you are African American.  Age. Risk increases with age.  Gender. Men are at higher risk than women before age 45 years. After age 65, women are at higher risk than men. Risk factors you can control include:  Not getting enough exercise or physical activity.  Being overweight.  Getting too much fat, sugar, calories, or salt in your diet.  Drinking too much alcohol. SIGNS AND SYMPTOMS Hypertension does not usually cause signs or symptoms. Extremely high blood pressure (hypertensive crisis) may cause headache, anxiety, shortness of breath, and nosebleed. DIAGNOSIS To check if you have hypertension, your health care provider will measure your blood pressure while you are seated, with your arm held at the level of your heart. It should be measured at least twice using the same arm. Certain conditions can cause a difference in blood pressure between your right and left arms. A blood pressure reading that is higher than normal on one occasion does not mean that you need treatment. If  it is not clear whether you have high blood pressure, you may be asked to return on a different day to have your blood pressure checked again. Or, you may be asked to monitor your blood pressure at home for 1 or more weeks. TREATMENT Treating high blood pressure includes making lifestyle changes and possibly taking medicine. Living a healthy lifestyle can help lower high blood pressure. You may need to change some of your habits. Lifestyle changes may include:  Following the DASH diet. This diet is high in fruits, vegetables, and whole grains. It is low in salt, red meat, and added sugars.  Keep your sodium intake below 2,300 mg per day.  Getting at least 30-45 minutes of aerobic exercise at least 4 times per week.  Losing weight if necessary.  Not smoking.  Limiting alcoholic beverages.  Learning ways to reduce stress. Your health care provider may prescribe medicine if lifestyle changes are not enough to get your blood pressure under control, and if one of the following is true:  You are 18-59 years of age and your systolic blood pressure is above 140.  You are 60 years of age or older, and your systolic blood pressure is above 150.  Your diastolic blood pressure is above 90.  You have diabetes, and your systolic blood pressure is over 140 or your diastolic blood pressure is over 90.  You have kidney disease and your blood pressure is above 140/90.  You have heart disease and your blood pressure is above 140/90. Your personal target blood pressure may vary depending on your medical conditions, your age, and other factors. HOME CARE INSTRUCTIONS    Have your blood pressure rechecked as directed by your health care provider.   Take medicines only as directed by your health care provider. Follow the directions carefully. Blood pressure medicines must be taken as prescribed. The medicine does not work as well when you skip doses. Skipping doses also puts you at risk for  problems.  Do not smoke.   Monitor your blood pressure at home as directed by your health care provider. SEEK MEDICAL CARE IF:   You think you are having a reaction to medicines taken.  You have recurrent headaches or feel dizzy.  You have swelling in your ankles.  You have trouble with your vision. SEEK IMMEDIATE MEDICAL CARE IF:  You develop a severe headache or confusion.  You have unusual weakness, numbness, or feel faint.  You have severe chest or abdominal pain.  You vomit repeatedly.  You have trouble breathing. MAKE SURE YOU:   Understand these instructions.  Will watch your condition.  Will get help right away if you are not doing well or get worse.   This information is not intended to replace advice given to you by your health care provider. Make sure you discuss any questions you have with your health care provider.   Document Released: 04/13/2005 Document Revised: 08/28/2014 Document Reviewed: 02/03/2013 Elsevier Interactive Patient Education 2016 [employer]  
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
## 2015-10-31 ENCOUNTER — Telehealth: Payer: Self-pay | Admitting: Family Medicine

## 2015-10-31 NOTE — Telephone Encounter (Signed)
Pamala Hurry from [facility] called to let pt. PCP know that the social worker that was helping the pt. Is out on  FMLA and does not know when she will be returning. Pt. Is out of medication and the cost for the medication are high  And something is wrong with his medicaid. Rep would like to know if pt. Needs to be referred out to another agency  That has a Education officer, museum. Please f/u

## 2015-11-01 ENCOUNTER — Ambulatory Visit: Payer: Medicare Other | Admitting: Family Medicine

## 2015-11-01 ENCOUNTER — Telehealth: Payer: Self-pay

## 2015-11-01 NOTE — Telephone Encounter (Signed)
This Case Manager placed return call to [facility] with [facility] [phone]). Pamala Hurry indicated the [place] is on FMLA, and she is uncertain when she will be returning. Patient having problems with Medicaid and is unable to afford his medications. Per Pamala Hurry, patient currently has over $100 of medications at pharmacy that he has been unable to pick up because he cannot afford medications. Pamala Hurry suggested patient being referred to another home health agency for [facility] RN (medication management services) and SW services. She indicated [facility] discharged patient from [facility] RN services so if PCP agreeable new home health orders would be needed.   This Case Manager placed call to patient to discuss. Informed patient that scripts for methocarbamol 500 mg tablet (Patient to take 1000 mg every six hours as needed for muscle spasms), tamsulosin 0.4 mg (1 tablet daily) can be transferred to pharmacy at [place] and put on account; however, unable to transfer narcotics as our pharmacy does not have these medications. Also informed patient that [place] and [place] would also be unable to transfer script for nicotine patch since this can be purchased over the counter. Patient indicated he did not want to transfer just a few medications because he needed all of the medications. Informed patient that we only that the capability of transferring two of his needed medications. Patient declined having medications transferred because he indicated the clinic was a long distance from his home. Patient wanted to see if Dr. Jarold Song agreeable to him being referred to another home health agency.   This Case Manager also encouraged patient to contact DSS to determine why Medicaid is not currently active. Patient indicated he had already contacted DSS, and DSS has to verify his finances. He has been informed once this has been done and if deemed  eligible his Medicaid will be reactivated. Will route telephone encounter to Dr. Jarold Song for her to determine if patient meets criteria for further [facility] RN/SW services.

## 2015-11-04 ENCOUNTER — Ambulatory Visit: Payer: Medicare Other

## 2015-11-04 NOTE — Telephone Encounter (Signed)
He seems pretty competent and independent. I am unsure of the indication for [facility] services; he has a sling but seems able to cope. I would like to know why he thinks he needs the services

## 2015-11-05 ENCOUNTER — Telehealth: Payer: Self-pay | Admitting: Family Medicine

## 2015-11-05 NOTE — Telephone Encounter (Signed)
Patient called and stated that doctor needs to contact [name]. He said [name] said that has been dischrged. The office is needing clarity from the doctor.  [name]  [phone]

## 2015-11-06 ENCOUNTER — Telehealth: Payer: Self-pay

## 2015-11-06 NOTE — Telephone Encounter (Signed)
Signed order for SN visit faxed to [place]  - fax # [phone]

## 2015-11-06 NOTE — Telephone Encounter (Signed)
Could you please follow up on this. Thank you.

## 2015-11-07 ENCOUNTER — Other Ambulatory Visit: Payer: Self-pay | Admitting: Family Medicine

## 2015-11-07 ENCOUNTER — Ambulatory Visit: Payer: Medicare Other | Attending: Family Medicine

## 2015-11-07 NOTE — Telephone Encounter (Signed)
Patient wants to use pharmacy at [facility]. Listed on file

## 2015-11-07 NOTE — Telephone Encounter (Signed)
Will route to Dr. Jarold Song

## 2015-11-07 NOTE — Telephone Encounter (Signed)
Patient is frustrated and is not understanding why he wasn't given refills on his oxycodone and tramadol..... Patient was informed that controlled substance request can take up to 48 hours to be refilled, if pcp consents to refill.  Patient stated he was told he could pick it up at [facility], patient went to pharmacy and medication was not ready or sent.  Patient would like pcp or nurse to return his call and explain the process and status on his request.  Patient stated it is against the law to be in pain and without medications,

## 2015-11-07 NOTE — Telephone Encounter (Signed)
Patient is need oxycodone, muscle relaxer,  Laxative. Please follow up

## 2015-11-08 MED ORDER — METHOCARBAMOL 500 MG PO TABS: 1000.0000 mg | ORAL_TABLET | Freq: Two times a day (BID) | ORAL | Status: DC | PRN

## 2015-11-08 MED ORDER — LACTULOSE 10 GM/15ML PO SOLN: 10.0000 g | Freq: Two times a day (BID) | ORAL | Status: DC | PRN
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
## 2015-11-13 ENCOUNTER — Telehealth: Payer: Self-pay

## 2015-11-13 DIAGNOSIS — S42024D Nondisplaced fracture of shaft of right clavicle, subsequent encounter for fracture with routine healing: Secondary | ICD-10-CM | POA: Diagnosis not present

## 2015-11-13 NOTE — Telephone Encounter (Signed)
This Case Manager placed call to [facility] to follow-up on patient's home health services. This Case Manager was informed that patient was discharged from home health services on 10/31/15.    This Case Manager spoke to Dr. Jarold Song regarding home health services for patient as patient was hopeful he could be transferred to another home health agency. Dr. Jarold Song indicated patient does not meet criteria for [facility] RN services at this time. This Case Manager placed call to patient and informed patient that Dr. Jarold Song indicated he does not meet criteria for [facility] RN services. Patient verbalized understanding. He expressed concern and frustration that his Medicare and Medicaid are no longer active. He indicated Medicare and Medicaid became inactive when he stopped receiving his monthly "check." Encouraged patient to contact DSS/SSA to determine how Medicare/Medicaid can be reactivated. Patient indicated he has contacted DSS and an appeal is pending. No additional needs/concerns identified at this time.

## 2015-12-27 ENCOUNTER — Telehealth: Payer: Self-pay | Admitting: Family Medicine

## 2015-12-27 NOTE — Telephone Encounter (Signed)
Pt. Called stating that he was referred to the urology and has an appointment for 01/30/16 and pt. Can not wait that long b/c he is in pain. Pt. Would like to know if he can be referred out some where else and be seen sooner. Please f/u

## 2015-12-31 NOTE — Telephone Encounter (Signed)
I called [facility] and patient has a new appointment om Thursday  9/7 @ 9am and Mr [name] is aware of that

## 2016-01-01 DIAGNOSIS — M25511 Pain in right shoulder: Secondary | ICD-10-CM | POA: Diagnosis not present

## 2016-01-02 ENCOUNTER — Encounter: Payer: Self-pay | Admitting: Urology

## 2016-01-02 DIAGNOSIS — R350 Frequency of micturition: Secondary | ICD-10-CM | POA: Diagnosis not present

## 2016-01-02 DIAGNOSIS — D4101 Neoplasm of uncertain behavior of right kidney: Secondary | ICD-10-CM | POA: Diagnosis not present

## 2016-01-02 DIAGNOSIS — N200 Calculus of kidney: Secondary | ICD-10-CM | POA: Diagnosis not present

## 2016-01-02 DIAGNOSIS — N401 Enlarged prostate with lower urinary tract symptoms: Secondary | ICD-10-CM | POA: Diagnosis not present

## 2016-01-30 DIAGNOSIS — H903 Sensorineural hearing loss, bilateral: Secondary | ICD-10-CM | POA: Diagnosis not present

## 2016-02-14 ENCOUNTER — Encounter (HOSPITAL_COMMUNITY): Payer: Self-pay

## 2016-02-14 ENCOUNTER — Emergency Department (HOSPITAL_COMMUNITY): Payer: Medicare Other

## 2016-02-14 ENCOUNTER — Emergency Department (HOSPITAL_COMMUNITY)
Admission: EM | Admit: 2016-02-14 | Discharge: 2016-02-14 | Disposition: A | Discharge status: Home / Self Care | Payer: Medicare Other | Attending: Emergency Medicine | Admitting: Emergency Medicine

## 2016-02-14 DIAGNOSIS — F17210 Nicotine dependence, cigarettes, uncomplicated: Secondary | ICD-10-CM | POA: Insufficient documentation

## 2016-02-14 DIAGNOSIS — M5442 Lumbago with sciatica, left side: Secondary | ICD-10-CM | POA: Diagnosis not present

## 2016-02-14 DIAGNOSIS — M545 Low back pain: Secondary | ICD-10-CM | POA: Diagnosis not present

## 2016-02-14 DIAGNOSIS — I10 Essential (primary) hypertension: Secondary | ICD-10-CM | POA: Insufficient documentation

## 2016-02-14 DIAGNOSIS — Z79899 Other long term (current) drug therapy: Secondary | ICD-10-CM | POA: Diagnosis not present

## 2016-02-14 DIAGNOSIS — M5432 Sciatica, left side: Secondary | ICD-10-CM

## 2016-02-14 LAB — CBC WITH DIFFERENTIAL/PLATELET
Basophils Absolute: 0 10*3/uL (ref 0.0–0.1)
Basophils Relative: 1 %
EOS ABS: 0.2 10*3/uL (ref 0.0–0.7)
Eosinophils Relative: 3 %
HEMATOCRIT: 39.4 % (ref 39.0–52.0)
HEMOGLOBIN: 13.5 g/dL (ref 13.0–17.0)
LYMPHS ABS: 2.6 10*3/uL (ref 0.7–4.0)
Lymphocytes Relative: 34 %
MCH: 28.5 pg (ref 26.0–34.0)
MCHC: 34.3 g/dL (ref 30.0–36.0)
MCV: 83.1 fL (ref 78.0–100.0)
MONOS PCT: 7 %
Monocytes Absolute: 0.6 10*3/uL (ref 0.1–1.0)
NEUTROS ABS: 4.3 10*3/uL (ref 1.7–7.7)
NEUTROS PCT: 55 %
Platelets: 260 10*3/uL (ref 150–400)
RBC: 4.74 MIL/uL (ref 4.22–5.81)
RDW: 13.5 % (ref 11.5–15.5)
WBC: 7.6 10*3/uL (ref 4.0–10.5)

## 2016-02-14 LAB — BASIC METABOLIC PANEL
Anion gap: 8 (ref 5–15)
BUN: 12 mg/dL (ref 6–20)
CHLORIDE: 104 mmol/L (ref 101–111)
CO2: 26 mmol/L (ref 22–32)
CREATININE: 0.96 mg/dL (ref 0.61–1.24)
Calcium: 9.3 mg/dL (ref 8.9–10.3)
GFR calc non Af Amer: >60 mL/min (ref >60)
Glucose, Bld: 90 mg/dL (ref 65–99)
POTASSIUM: 3.6 mmol/L (ref 3.5–5.1)
SODIUM: 138 mmol/L (ref 135–145)

## 2016-02-14 LAB — URINALYSIS, ROUTINE W REFLEX MICROSCOPIC
Glucose, UA: NEGATIVE mg/dL
Hgb urine dipstick: NEGATIVE
Ketones, ur: 15 mg/dL — AB
Leukocytes, UA: NEGATIVE
NITRITE: NEGATIVE
PH: 5.5 (ref 5.0–8.0)
Protein, ur: NEGATIVE mg/dL
SPECIFIC GRAVITY, URINE: 1.025 (ref 1.005–1.030)

## 2016-02-14 MED ORDER — NAPROXEN 500 MG PO TABS: 500.0000 mg | ORAL_TABLET | Freq: Two times a day (BID) | ORAL | 0 refills | Status: DC

## 2016-02-14 MED ORDER — METHOCARBAMOL 500 MG PO TABS
1000.0000 mg | ORAL_TABLET | Freq: Once | ORAL | Status: AC
  Administered 2016-02-14: 1000 mg via ORAL
  Filled 2016-02-14: qty 2

## 2016-02-14 MED ORDER — METHOCARBAMOL 1000 MG/10ML IJ SOLN
1000.0000 mg | Freq: Once | INTRAMUSCULAR | Status: DC
  Filled 2016-02-14: qty 10

## 2016-02-14 MED ORDER — METHOCARBAMOL 500 MG PO TABS: 500.0000 mg | ORAL_TABLET | Freq: Three times a day (TID) | ORAL | 0 refills | Status: DC | PRN

## 2016-02-14 MED ORDER — KETOROLAC TROMETHAMINE 30 MG/ML IJ SOLN
30.0000 mg | Freq: Once | INTRAMUSCULAR | Status: AC
  Administered 2016-02-14: 30 mg via INTRAVENOUS
  Filled 2016-02-14: qty 1

## 2016-02-14 NOTE — ED Notes (Signed)
Patient transported to X-ray 

## 2016-02-14 NOTE — ED Triage Notes (Signed)
Pt complaining of lower back pain. Seen by PCP, told has disk problem at L1. Pt states increasing back pain x 3 days. Pt complaining of some urinary incontinence. Pt states lives on 2nd floor, trouble going up and down stairs.
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
## 2016-02-14 NOTE — ED Notes (Signed)
Post void 35

## 2016-02-16 LAB — URINE CULTURE

## 2016-02-19 ENCOUNTER — Telehealth: Payer: Self-pay | Admitting: Family Medicine

## 2016-02-24 ENCOUNTER — Emergency Department (HOSPITAL_COMMUNITY): Payer: Medicare Other

## 2016-02-24 ENCOUNTER — Encounter (HOSPITAL_COMMUNITY): Payer: Self-pay

## 2016-02-24 ENCOUNTER — Emergency Department (HOSPITAL_COMMUNITY)
Admission: EM | Admit: 2016-02-24 | Discharge: 2016-02-25 | Disposition: A | Discharge status: Home / Self Care | Payer: Medicare Other | Attending: Emergency Medicine | Admitting: Emergency Medicine

## 2016-02-24 DIAGNOSIS — N132 Hydronephrosis with renal and ureteral calculous obstruction: Secondary | ICD-10-CM | POA: Diagnosis not present

## 2016-02-24 DIAGNOSIS — F17210 Nicotine dependence, cigarettes, uncomplicated: Secondary | ICD-10-CM | POA: Insufficient documentation

## 2016-02-24 DIAGNOSIS — I10 Essential (primary) hypertension: Secondary | ICD-10-CM | POA: Diagnosis not present

## 2016-02-24 DIAGNOSIS — R1030 Lower abdominal pain, unspecified: Secondary | ICD-10-CM | POA: Diagnosis present

## 2016-02-24 DIAGNOSIS — N201 Calculus of ureter: Secondary | ICD-10-CM

## 2016-02-24 DIAGNOSIS — N202 Calculus of kidney with calculus of ureter: Secondary | ICD-10-CM | POA: Insufficient documentation

## 2016-02-24 LAB — URINALYSIS, ROUTINE W REFLEX MICROSCOPIC
BILIRUBIN URINE: NEGATIVE
GLUCOSE, UA: NEGATIVE mg/dL
KETONES UR: NEGATIVE mg/dL
Leukocytes, UA: NEGATIVE
Nitrite: NEGATIVE
PH: 5.5 (ref 5.0–8.0)
PROTEIN: NEGATIVE mg/dL
Specific Gravity, Urine: 1.004 — ABNORMAL LOW (ref 1.005–1.030)

## 2016-02-24 LAB — CBC WITH DIFFERENTIAL/PLATELET
BASOS PCT: 0 %
Basophils Absolute: 0 10*3/uL (ref 0.0–0.1)
Eosinophils Absolute: 0.2 10*3/uL (ref 0.0–0.7)
Eosinophils Relative: 2 %
HEMATOCRIT: 39.9 % (ref 39.0–52.0)
HEMOGLOBIN: 13.7 g/dL (ref 13.0–17.0)
LYMPHS ABS: 2.6 10*3/uL (ref 0.7–4.0)
LYMPHS PCT: 23 %
MCH: 28.4 pg (ref 26.0–34.0)
MCHC: 34.3 g/dL (ref 30.0–36.0)
MCV: 82.6 fL (ref 78.0–100.0)
MONOS PCT: 10 %
Monocytes Absolute: 1 10*3/uL (ref 0.1–1.0)
NEUTROS ABS: 7.2 10*3/uL (ref 1.7–7.7)
NEUTROS PCT: 65 %
Platelets: 232 10*3/uL (ref 150–400)
RBC: 4.83 MIL/uL (ref 4.22–5.81)
RDW: 13.6 % (ref 11.5–15.5)
WBC: 10.9 10*3/uL — ABNORMAL HIGH (ref 4.0–10.5)

## 2016-02-24 LAB — URINE MICROSCOPIC-ADD ON: Bacteria, UA: NONE SEEN

## 2016-02-24 MED ORDER — ONDANSETRON HCL 4 MG/2ML IJ SOLN
4.0000 mg | Freq: Once | INTRAMUSCULAR | Status: AC
  Administered 2016-02-25: 4 mg via INTRAVENOUS
  Filled 2016-02-24: qty 2

## 2016-02-24 MED ORDER — OXYCODONE-ACETAMINOPHEN 5-325 MG PO TABS
1.0000 | ORAL_TABLET | ORAL | Status: DC | PRN
  Administered 2016-02-24: 1 via ORAL

## 2016-02-24 MED ORDER — OXYCODONE-ACETAMINOPHEN 5-325 MG PO TABS
ORAL_TABLET | ORAL | Status: AC
  Filled 2016-02-24: qty 1

## 2016-02-24 MED ORDER — LISINOPRIL 10 MG PO TABS
5.0000 mg | ORAL_TABLET | Freq: Once | ORAL | Status: AC
  Administered 2016-02-25: 5 mg via ORAL
  Filled 2016-02-24: qty 1

## 2016-02-24 MED ORDER — MORPHINE SULFATE (PF) 4 MG/ML IV SOLN
4.0000 mg | Freq: Once | INTRAVENOUS | Status: AC
  Administered 2016-02-25: 4 mg via INTRAVENOUS
  Filled 2016-02-24: qty 1

## 2016-02-24 NOTE — ED Notes (Signed)
Pt reports R abd, "liver pain", and "bladder pain". Pt reports dysuria, hematuria, and vomiting. Pt attempted to urinate but was having difficulty. Bladder scan showed 201 mL. Denies fever or chills.

## 2016-02-24 NOTE — ED Triage Notes (Signed)
Per Pt, Pt is deaf communicating through written word. Pt reports having Kidney mass where he was to see the MD on 11/9. Pt reports Hx of the same since he was 78. In the past two days, pt has had blood noted in his urine. Reports nausea, vomiting, and diarrhea.

## 2016-02-24 NOTE — ED Notes (Signed)
ASL interpreter device and EDP at bedside.

## 2016-02-24 NOTE — ED Notes (Signed)
Pt transported to CT ?
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
## 2016-02-24 NOTE — ED Notes (Addendum)
Tech first went to help patient in the BR and noticed blood in the toilet

## 2016-02-24 NOTE — ED Notes (Signed)
Patient given Crackers, and ginger ali

## 2016-02-25 DIAGNOSIS — N202 Calculus of kidney with calculus of ureter: Secondary | ICD-10-CM | POA: Diagnosis not present

## 2016-02-25 LAB — COMPREHENSIVE METABOLIC PANEL
ALK PHOS: 98 U/L (ref 38–126)
ALT: 21 U/L (ref 17–63)
AST: 36 U/L (ref 15–41)
Albumin: 3.8 g/dL (ref 3.5–5.0)
Anion gap: 9 (ref 5–15)
BUN: 20 mg/dL (ref 6–20)
CALCIUM: 9.4 mg/dL (ref 8.9–10.3)
CHLORIDE: 104 mmol/L (ref 101–111)
CO2: 22 mmol/L (ref 22–32)
CREATININE: 1.7 mg/dL — AB (ref 0.61–1.24)
GFR, EST AFRICAN AMERICAN: 52 mL/min — AB (ref >60)
GFR, EST NON AFRICAN AMERICAN: 45 mL/min — AB (ref >60)
Glucose, Bld: 96 mg/dL (ref 65–99)
Potassium: 3.6 mmol/L (ref 3.5–5.1)
Sodium: 135 mmol/L (ref 135–145)
Total Bilirubin: 0.8 mg/dL (ref 0.3–1.2)
Total Protein: 6.7 g/dL (ref 6.5–8.1)

## 2016-02-25 MED ORDER — TAMSULOSIN HCL 0.4 MG PO CAPS: 0.4000 mg | ORAL_CAPSULE | Freq: Every day | ORAL | 0 refills | Status: DC

## 2016-02-25 MED ORDER — OXYCODONE-ACETAMINOPHEN 5-325 MG PO TABS: 2.0000 | ORAL_TABLET | ORAL | 0 refills | Status: DC | PRN

## 2016-02-25 MED ORDER — KETOROLAC TROMETHAMINE 15 MG/ML IJ SOLN
15.0000 mg | Freq: Once | INTRAMUSCULAR | Status: AC
  Administered 2016-02-25: 15 mg via INTRAVENOUS
  Filled 2016-02-25: qty 1

## 2016-02-25 MED ORDER — HYDROMORPHONE HCL 2 MG/ML IJ SOLN
1.0000 mg | Freq: Once | INTRAMUSCULAR | Status: AC
  Administered 2016-02-25: 1 mg via INTRAVENOUS
  Filled 2016-02-25: qty 1

## 2016-02-25 MED ORDER — HYDROMORPHONE HCL 2 MG/ML IJ SOLN
0.5000 mg | Freq: Once | INTRAMUSCULAR | Status: AC
  Administered 2016-02-25: 0.5 mg via INTRAVENOUS
  Filled 2016-02-25: qty 1

## 2016-02-25 MED ORDER — ONDANSETRON HCL 4 MG PO TABS: 4.0000 mg | ORAL_TABLET | Freq: Four times a day (QID) | ORAL | 0 refills | Status: DC

## 2016-02-25 MED ORDER — TAMSULOSIN HCL 0.4 MG PO CAPS
0.4000 mg | ORAL_CAPSULE | Freq: Once | ORAL | Status: AC
  Administered 2016-02-25: 0.4 mg via ORAL
  Filled 2016-02-25: qty 1

## 2016-02-25 MED ORDER — SODIUM CHLORIDE 0.9 % IV BOLUS (SEPSIS)
1000.0000 mL | Freq: Once | INTRAVENOUS | Status: AC
  Administered 2016-02-25: 1000 mL via INTRAVENOUS

## 2016-02-25 NOTE — ED Provider Notes (Signed)
2:46 AM Resolution of pain. Dc home. Urology follow up. Told to return to ER for new or worsening symptoms   Jola Schmidt, MD 02/25/16 [phone]
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
## 2016-02-25 NOTE — ED Notes (Signed)
Pt able to void ~100 mL of urine but reports he "still feels like he has to go but can't".

## 2016-02-25 NOTE — Discharge Instructions (Addendum)
You have a 6 mm kidney stone. You need to take the pain medicine as needed. I have also given you Zofran for nausea you may take as needed.   Take the Flomax daily.   And follow-up with urology this week. They also need to recheck your creatine.  You can use a strainer at home when he urinates to determine if you passed your stone. Please return to the ED if you develop worsening symptoms, fever, or for any other reason.

## 2016-02-25 NOTE — ED Notes (Signed)
Pt offered [place] sandwich and juice.

## 2016-02-27 DIAGNOSIS — H903 Sensorineural hearing loss, bilateral: Secondary | ICD-10-CM | POA: Diagnosis not present

## 2016-03-02 DIAGNOSIS — H903 Sensorineural hearing loss, bilateral: Secondary | ICD-10-CM | POA: Diagnosis not present

## 2016-03-10 ENCOUNTER — Encounter (HOSPITAL_COMMUNITY)
Admission: EM | Disposition: A | Discharge status: Home / Self Care | Payer: Self-pay | Source: Home / Self Care | Attending: Emergency Medicine

## 2016-03-10 ENCOUNTER — Observation Stay (HOSPITAL_COMMUNITY)
Admission: EM | Admit: 2016-03-10 | Discharge: 2016-03-11 | Disposition: A | Discharge status: Home / Self Care | Payer: Medicare Other | Attending: Urology | Admitting: Urology

## 2016-03-10 ENCOUNTER — Encounter (HOSPITAL_COMMUNITY): Payer: Self-pay | Admitting: Radiology

## 2016-03-10 ENCOUNTER — Emergency Department (HOSPITAL_COMMUNITY): Payer: Medicare Other

## 2016-03-10 ENCOUNTER — Emergency Department (HOSPITAL_COMMUNITY): Payer: Medicare Other | Admitting: Certified Registered Nurse Anesthetist

## 2016-03-10 ENCOUNTER — Ambulatory Visit: Admit: 2016-03-10 | Payer: No Typology Code available for payment source | Admitting: Urology

## 2016-03-10 DIAGNOSIS — M4802 Spinal stenosis, cervical region: Secondary | ICD-10-CM | POA: Diagnosis not present

## 2016-03-10 DIAGNOSIS — N201 Calculus of ureter: Secondary | ICD-10-CM

## 2016-03-10 DIAGNOSIS — F17210 Nicotine dependence, cigarettes, uncomplicated: Secondary | ICD-10-CM | POA: Diagnosis not present

## 2016-03-10 DIAGNOSIS — K5900 Constipation, unspecified: Secondary | ICD-10-CM | POA: Insufficient documentation

## 2016-03-10 DIAGNOSIS — Z87442 Personal history of urinary calculi: Secondary | ICD-10-CM | POA: Diagnosis not present

## 2016-03-10 DIAGNOSIS — Z885 Allergy status to narcotic agent status: Secondary | ICD-10-CM | POA: Diagnosis not present

## 2016-03-10 DIAGNOSIS — F329 Major depressive disorder, single episode, unspecified: Secondary | ICD-10-CM | POA: Diagnosis not present

## 2016-03-10 DIAGNOSIS — H905 Unspecified sensorineural hearing loss: Secondary | ICD-10-CM | POA: Diagnosis not present

## 2016-03-10 DIAGNOSIS — M19012 Primary osteoarthritis, left shoulder: Secondary | ICD-10-CM | POA: Insufficient documentation

## 2016-03-10 DIAGNOSIS — N23 Unspecified renal colic: Secondary | ICD-10-CM | POA: Diagnosis not present

## 2016-03-10 DIAGNOSIS — N529 Male erectile dysfunction, unspecified: Secondary | ICD-10-CM | POA: Insufficient documentation

## 2016-03-10 DIAGNOSIS — I10 Essential (primary) hypertension: Secondary | ICD-10-CM | POA: Diagnosis not present

## 2016-03-10 DIAGNOSIS — M5412 Radiculopathy, cervical region: Secondary | ICD-10-CM | POA: Diagnosis not present

## 2016-03-10 DIAGNOSIS — N200 Calculus of kidney: Secondary | ICD-10-CM

## 2016-03-10 DIAGNOSIS — N2889 Other specified disorders of kidney and ureter: Secondary | ICD-10-CM | POA: Diagnosis not present

## 2016-03-10 DIAGNOSIS — N132 Hydronephrosis with renal and ureteral calculous obstruction: Principal | ICD-10-CM | POA: Insufficient documentation

## 2016-03-10 DIAGNOSIS — R1031 Right lower quadrant pain: Secondary | ICD-10-CM | POA: Diagnosis not present

## 2016-03-10 DIAGNOSIS — D649 Anemia, unspecified: Secondary | ICD-10-CM | POA: Insufficient documentation

## 2016-03-10 DIAGNOSIS — N179 Acute kidney failure, unspecified: Secondary | ICD-10-CM

## 2016-03-10 DIAGNOSIS — N202 Calculus of kidney with calculus of ureter: Secondary | ICD-10-CM | POA: Diagnosis not present

## 2016-03-10 DIAGNOSIS — R109 Unspecified abdominal pain: Secondary | ICD-10-CM | POA: Diagnosis not present

## 2016-03-10 DIAGNOSIS — K921 Melena: Secondary | ICD-10-CM | POA: Diagnosis not present

## 2016-03-10 DIAGNOSIS — R338 Other retention of urine: Secondary | ICD-10-CM | POA: Diagnosis not present

## 2016-03-10 DIAGNOSIS — R112 Nausea with vomiting, unspecified: Secondary | ICD-10-CM | POA: Diagnosis not present

## 2016-03-10 HISTORY — PX: CYSTOSCOPY W/ URETERAL STENT PLACEMENT: SHX1429

## 2016-03-10 LAB — CBC WITH DIFFERENTIAL/PLATELET
BASOS ABS: 0.1 10*3/uL (ref 0.0–0.1)
BASOS PCT: 0 %
EOS ABS: 0.3 10*3/uL (ref 0.0–0.7)
EOS PCT: 2 %
HCT: 37.8 % — ABNORMAL LOW (ref 39.0–52.0)
HEMOGLOBIN: 12.8 g/dL — AB (ref 13.0–17.0)
Lymphocytes Relative: 18 %
Lymphs Abs: 2.4 10*3/uL (ref 0.7–4.0)
MCH: 27.9 pg (ref 26.0–34.0)
MCHC: 33.9 g/dL (ref 30.0–36.0)
MCV: 82.4 fL (ref 78.0–100.0)
Monocytes Absolute: 1.2 10*3/uL — ABNORMAL HIGH (ref 0.1–1.0)
Monocytes Relative: 9 %
NEUTROS PCT: 71 %
Neutro Abs: 9.7 10*3/uL — ABNORMAL HIGH (ref 1.7–7.7)
PLATELETS: 439 10*3/uL — AB (ref 150–400)
RBC: 4.59 MIL/uL (ref 4.22–5.81)
RDW: 13.2 % (ref 11.5–15.5)
WBC: 13.6 10*3/uL — AB (ref 4.0–10.5)

## 2016-03-10 LAB — URINE MICROSCOPIC-ADD ON
BACTERIA UA: NONE SEEN
Squamous Epithelial / LPF: NONE SEEN

## 2016-03-10 LAB — BASIC METABOLIC PANEL
ANION GAP: 10 (ref 5–15)
BUN: 30 mg/dL — AB (ref 6–20)
CO2: 25 mmol/L (ref 22–32)
Calcium: 10.1 mg/dL (ref 8.9–10.3)
Chloride: 103 mmol/L (ref 101–111)
Creatinine, Ser: 1.86 mg/dL — ABNORMAL HIGH (ref 0.61–1.24)
GFR, EST AFRICAN AMERICAN: 46 mL/min — AB (ref >60)
GFR, EST NON AFRICAN AMERICAN: 40 mL/min — AB (ref >60)
Glucose, Bld: 99 mg/dL (ref 65–99)
POTASSIUM: 4.3 mmol/L (ref 3.5–5.1)
SODIUM: 138 mmol/L (ref 135–145)

## 2016-03-10 LAB — URINALYSIS, ROUTINE W REFLEX MICROSCOPIC
Bilirubin Urine: NEGATIVE
GLUCOSE, UA: NEGATIVE mg/dL
Ketones, ur: NEGATIVE mg/dL
NITRITE: NEGATIVE
PH: 5.5 (ref 5.0–8.0)
Protein, ur: NEGATIVE mg/dL
SPECIFIC GRAVITY, URINE: 1.023 (ref 1.005–1.030)

## 2016-03-10 SURGERY — CYSTOSCOPY, WITH RETROGRADE PYELOGRAM AND URETERAL STENT INSERTION
Anesthesia: General | Laterality: Right

## 2016-03-10 MED ORDER — NICOTINE 14 MG/24HR TD PT24
14.0000 mg | MEDICATED_PATCH | Freq: Every day | TRANSDERMAL | Status: DC
  Administered 2016-03-10 – 2016-03-11 (×3): 14 mg via TRANSDERMAL
  Filled 2016-03-10 (×3): qty 1

## 2016-03-10 MED ORDER — FENTANYL CITRATE (PF) 100 MCG/2ML IJ SOLN
INTRAMUSCULAR | Status: DC | PRN
  Administered 2016-03-10: 50 ug via INTRAVENOUS

## 2016-03-10 MED ORDER — POLYETHYLENE GLYCOL 3350 17 G PO PACK: 17.0000 g | PACK | Freq: Every day | ORAL | Status: DC | PRN

## 2016-03-10 MED ORDER — FENTANYL CITRATE (PF) 100 MCG/2ML IJ SOLN
25.0000 ug | INTRAMUSCULAR | Status: DC | PRN
  Administered 2016-03-10 (×2): 50 ug via INTRAVENOUS

## 2016-03-10 MED ORDER — BISACODYL 10 MG RE SUPP: 10.0000 mg | Freq: Every day | RECTAL | Status: DC | PRN

## 2016-03-10 MED ORDER — SODIUM CHLORIDE 0.9 % IV BOLUS (SEPSIS): 1000.0000 mL | Freq: Once | INTRAVENOUS | Status: DC

## 2016-03-10 MED ORDER — LACTATED RINGERS IV SOLN
INTRAVENOUS | Status: DC | PRN
  Administered 2016-03-10: 18:00:00 via INTRAVENOUS

## 2016-03-10 MED ORDER — HYDROMORPHONE HCL 1 MG/ML IJ SOLN
INTRAMUSCULAR | Status: AC
  Filled 2016-03-10: qty 1

## 2016-03-10 MED ORDER — METHOCARBAMOL 500 MG PO TABS
500.0000 mg | ORAL_TABLET | Freq: Three times a day (TID) | ORAL | Status: DC | PRN
  Administered 2016-03-11: 500 mg via ORAL
  Filled 2016-03-10 (×2): qty 1

## 2016-03-10 MED ORDER — PHENYLEPHRINE 40 MCG/ML (10ML) SYRINGE FOR IV PUSH (FOR BLOOD PRESSURE SUPPORT)
PREFILLED_SYRINGE | INTRAVENOUS | Status: AC
  Filled 2016-03-10: qty 10

## 2016-03-10 MED ORDER — SUCCINYLCHOLINE CHLORIDE 20 MG/ML IJ SOLN
INTRAMUSCULAR | Status: AC
  Filled 2016-03-10: qty 1

## 2016-03-10 MED ORDER — LACTATED RINGERS IV SOLN
INTRAVENOUS | Status: DC
  Administered 2016-03-10 (×2): via INTRAVENOUS

## 2016-03-10 MED ORDER — PROMETHAZINE HCL 25 MG/ML IJ SOLN: 6.2500 mg | INTRAMUSCULAR | Status: DC | PRN

## 2016-03-10 MED ORDER — PNEUMOCOCCAL 13-VAL CONJ VACC IM SUSP
0.5000 mL | INTRAMUSCULAR | Status: AC
  Administered 2016-03-11: 0.5 mL via INTRAMUSCULAR
  Filled 2016-03-10: qty 0.5

## 2016-03-10 MED ORDER — TRAMADOL HCL 50 MG PO TABS
100.0000 mg | ORAL_TABLET | Freq: Four times a day (QID) | ORAL | Status: DC
  Administered 2016-03-10 (×2): 100 mg via ORAL
  Filled 2016-03-10 (×3): qty 2

## 2016-03-10 MED ORDER — PROPOFOL 10 MG/ML IV BOLUS
INTRAVENOUS | Status: AC
  Filled 2016-03-10: qty 20

## 2016-03-10 MED ORDER — ONDANSETRON HCL 4 MG/2ML IJ SOLN: 4.0000 mg | Freq: Four times a day (QID) | INTRAMUSCULAR | Status: DC | PRN

## 2016-03-10 MED ORDER — HYDROMORPHONE HCL 1 MG/ML IJ SOLN
1.0000 mg | Freq: Once | INTRAMUSCULAR | Status: AC
  Administered 2016-03-10: 1 mg via INTRAVENOUS
  Filled 2016-03-10: qty 1

## 2016-03-10 MED ORDER — PNEUMOCOCCAL VAC POLYVALENT 25 MCG/0.5ML IJ INJ: 0.5000 mL | INJECTION | INTRAMUSCULAR | Status: DC

## 2016-03-10 MED ORDER — SODIUM CHLORIDE 0.9 % IV BOLUS (SEPSIS)
1000.0000 mL | Freq: Once | INTRAVENOUS | Status: AC
  Administered 2016-03-10: 1000 mL via INTRAVENOUS

## 2016-03-10 MED ORDER — FENTANYL CITRATE (PF) 100 MCG/2ML IJ SOLN
INTRAMUSCULAR | Status: AC
  Filled 2016-03-10: qty 2

## 2016-03-10 MED ORDER — LIDOCAINE HCL (CARDIAC) 20 MG/ML IV SOLN
INTRAVENOUS | Status: DC | PRN
  Administered 2016-03-10: 100 mg via INTRAVENOUS

## 2016-03-10 MED ORDER — ONDANSETRON HCL 4 MG/2ML IJ SOLN
4.0000 mg | Freq: Once | INTRAMUSCULAR | Status: AC
  Administered 2016-03-10: 4 mg via INTRAVENOUS
  Filled 2016-03-10: qty 2

## 2016-03-10 MED ORDER — LIDOCAINE 2% (20 MG/ML) 5 ML SYRINGE
INTRAMUSCULAR | Status: AC
  Filled 2016-03-10: qty 5

## 2016-03-10 MED ORDER — MIDAZOLAM HCL 2 MG/2ML IJ SOLN
INTRAMUSCULAR | Status: AC
  Filled 2016-03-10: qty 2

## 2016-03-10 MED ORDER — ONDANSETRON HCL 4 MG PO TABS: 4.0000 mg | ORAL_TABLET | Freq: Four times a day (QID) | ORAL | Status: DC | PRN

## 2016-03-10 MED ORDER — ONDANSETRON HCL 4 MG/2ML IJ SOLN
INTRAMUSCULAR | Status: AC
  Filled 2016-03-10: qty 2

## 2016-03-10 MED ORDER — SODIUM CHLORIDE 0.9 % IV SOLN
INTRAVENOUS | Status: DC | PRN
  Administered 2016-03-10: 19:00:00

## 2016-03-10 MED ORDER — MIDAZOLAM HCL 5 MG/5ML IJ SOLN
INTRAMUSCULAR | Status: DC | PRN
  Administered 2016-03-10 (×2): 1 mg via INTRAVENOUS

## 2016-03-10 MED ORDER — DEXAMETHASONE SODIUM PHOSPHATE 10 MG/ML IJ SOLN
INTRAMUSCULAR | Status: AC
  Filled 2016-03-10: qty 1

## 2016-03-10 MED ORDER — DOCUSATE SODIUM 100 MG PO CAPS
100.0000 mg | ORAL_CAPSULE | Freq: Two times a day (BID) | ORAL | Status: DC
  Administered 2016-03-10 – 2016-03-11 (×2): 100 mg via ORAL
  Filled 2016-03-10 (×2): qty 1

## 2016-03-10 MED ORDER — SUCCINYLCHOLINE CHLORIDE 20 MG/ML IJ SOLN
INTRAMUSCULAR | Status: DC | PRN
  Administered 2016-03-10: 100 mg via INTRAVENOUS

## 2016-03-10 MED ORDER — PROPOFOL 10 MG/ML IV BOLUS
INTRAVENOUS | Status: DC | PRN
  Administered 2016-03-10: 150 mg via INTRAVENOUS

## 2016-03-10 MED ORDER — ONDANSETRON HCL 4 MG/2ML IJ SOLN
INTRAMUSCULAR | Status: DC | PRN
  Administered 2016-03-10: 4 mg via INTRAVENOUS

## 2016-03-10 MED ORDER — CEFTRIAXONE SODIUM 1 G IJ SOLR
1.0000 g | Freq: Once | INTRAMUSCULAR | Status: AC
  Administered 2016-03-10: 1 g via INTRAVENOUS
  Filled 2016-03-10: qty 10

## 2016-03-10 MED ORDER — PHENYLEPHRINE HCL 10 MG/ML IJ SOLN
INTRAMUSCULAR | Status: DC | PRN
  Administered 2016-03-10: 80 ug via INTRAVENOUS

## 2016-03-10 MED ORDER — SODIUM CHLORIDE 0.9 % IR SOLN
Status: DC | PRN
  Administered 2016-03-10: 2000 mL

## 2016-03-10 MED ORDER — DEXAMETHASONE SODIUM PHOSPHATE 10 MG/ML IJ SOLN
INTRAMUSCULAR | Status: DC | PRN
  Administered 2016-03-10: 10 mg via INTRAVENOUS

## 2016-03-10 SURGICAL SUPPLY — 18 items
BAG URO CATCHER STRL LF (MISCELLANEOUS) ×3 IMPLANT
BASKET DAKOTA 1.9FR 11X120 (BASKET) IMPLANT
BASKET ZERO TIP NITINOL 2.4FR (BASKET) IMPLANT
BSKT STON RTRVL ZERO TP 2.4FR (BASKET)
CATH URET 5FR 28IN OPEN ENDED (CATHETERS) ×3 IMPLANT
CLOTH BEACON ORANGE TIMEOUT ST (SAFETY) ×3 IMPLANT
GLOVE BIOGEL M STRL SZ7.5 (GLOVE) ×3 IMPLANT
GOWN STRL REUS W/TWL XL LVL3 (GOWN DISPOSABLE) ×3 IMPLANT
GUIDEWIRE ANG ZIPWIRE 038X150 (WIRE) IMPLANT
GUIDEWIRE STR DUAL SENSOR (WIRE) ×3 IMPLANT
MANIFOLD NEPTUNE II (INSTRUMENTS) ×3 IMPLANT
PACK CYSTO (CUSTOM PROCEDURE TRAY) ×3 IMPLANT
SHEATH ACCESS URETERAL 24CM (SHEATH) IMPLANT
SHEATH ACCESS URETERAL 38CM (SHEATH) IMPLANT
SHEATH ACCESS URETERAL 54CM (SHEATH) IMPLANT
STENT URET 6FRX26 CONTOUR (STENTS) ×3 IMPLANT
TUBING CONNECTING 10 (TUBING) ×2 IMPLANT
TUBING CONNECTING 10' (TUBING) ×1

## 2016-03-10 NOTE — Anesthesia Postprocedure Evaluation (Signed)
Anesthesia Post Note  Patient: [name]  Procedure(s) Performed: Procedure(s) (LRB): CYSTOSCOPY WITH RETROGRADE PYELOGRAM/URETERAL STENT PLACEMENT (Right)  Patient location during evaluation: PACU Anesthesia Type: General Level of consciousness: awake and alert Pain management: pain level controlled Vital Signs Assessment: post-procedure vital signs reviewed and stable Respiratory status: spontaneous breathing, nonlabored ventilation, respiratory function stable and patient connected to nasal cannula oxygen Cardiovascular status: blood pressure returned to baseline and stable Postop Assessment: no signs of nausea or vomiting Anesthetic complications: no    Last Vitals:  Vitals:   03/10/16 1930 03/10/16 1955  BP: 128/89 (!) 136/104  Pulse: 87 89  Resp: (!) 21 12  Temp:      Last Pain:  Vitals:   03/10/16 1300  TempSrc: Oral  PainSc:                  Catalina Gravel

## 2016-03-10 NOTE — Transfer of Care (Signed)
Immediate Anesthesia Transfer of Care Note  Patient: [name]  Procedure(s) Performed: Procedure(s): CYSTOSCOPY WITH RETROGRADE PYELOGRAM/URETERAL STENT PLACEMENT (Right)  Patient Location: PACU  Anesthesia Type:General  Level of Consciousness:  sedated, patient cooperative and responds to stimulation  Airway & Oxygen Therapy:Patient Spontanous Breathing and Patient connected to face mask oxgen  Post-op Assessment:  Report given to PACU RN and Post -op Vital signs reviewed and stable  Post vital signs:  Reviewed and stable  Last Vitals:  Vitals:   03/10/16 1458 03/10/16 1656  BP: (!) 141/104 (!) 180/118  Pulse: 80 90  Resp: 18 20  Temp:      Complications: No apparent anesthesia complications

## 2016-03-10 NOTE — Anesthesia Preprocedure Evaluation (Addendum)
Anesthesia Evaluation  Patient identified by MRN, date of birth, ID band Patient awake    Reviewed: Allergy & Precautions, NPO status , Patient's Chart, lab work & pertinent test results  History of Anesthesia Complications Negative for: history of anesthetic complications  Airway Mallampati: II  TM Distance: >3 FB Neck ROM: Full    Dental  (+) Teeth Intact, Dental Advisory Given   Pulmonary Current Smoker,    Pulmonary exam normal breath sounds clear to auscultation       Cardiovascular hypertension, Pt. on medications Normal cardiovascular exam Rhythm:Regular Rate:Normal     Neuro/Psych PSYCHIATRIC DISORDERS Depression Cervical spinal stenosis Congenital deafness   Neuromuscular disease    GI/Hepatic negative GI ROS, Neg liver ROS,   Endo/Other  negative endocrine ROS  Renal/GU Renal InsufficiencyRenal disease (AKI)Nephrolithiasis h/o renal mass     Musculoskeletal  (+) Arthritis , Osteoarthritis,    Abdominal   Peds  Hematology  (+) Blood dyscrasia, anemia ,   Anesthesia Other Findings Day of surgery medications reviewed with the patient.  Reproductive/Obstetrics                            Anesthesia Physical Anesthesia Plan  ASA: II  Anesthesia Plan: General   Post-op Pain Management:    Induction: Intravenous  Airway Management Planned: Oral ETT  Additional Equipment:   Intra-op Plan:   Post-operative Plan: Extubation in OR  Informed Consent: I have reviewed the patients History and Physical, chart, labs and discussed the procedure including the risks, benefits and alternatives for the proposed anesthesia with the patient or authorized representative who has indicated his/her understanding and acceptance.   Dental advisory given  Plan Discussed with: CRNA  Anesthesia Plan Comments: (Risks/benefits of general anesthesia discussed with patient including risk of  damage to teeth, lips, gum, and tongue, nausea/vomiting, allergic reactions to medications, and the possibility of heart attack, stroke and death.  All patient questions answered.  Patient wishes to proceed.)        Anesthesia Quick Evaluation

## 2016-03-10 NOTE — Op Note (Signed)
Preoperative diagnosis:  1. Right ureteral stone   Postoperative diagnosis:  1. Same   Procedure:  1. Cystoscopy 2. right ureteral stent placement 3. right retrograde pyelography with interpretation   Surgeon: Ardis Hughs, MD  Anesthesia: General  Complications: None  Intraoperative findings:  right retrograde pyelography demonstrated a filling defect within the right ureter consistent with the patient's known calculus without other abnormalities.  In addition, it was difficult to advance the wire beyond the mid ureteral stone given the tortuosity and the degree of obstruction. Ultimately, I was able to get the catheter up beyond the stone and into the renal pelvis.  EBL: Minimal  Specimens: None  Indication: [name] is a 55 y.o. patient with large proximal right ureteral stone with associated hydronephrosis. There was just stone in the lower pole of the right kidney. The patient also has a right lower pole mass.. After reviewing the management options for treatment, he elected to proceed with the above surgical procedure(s). We have discussed the potential benefits and risks of the procedure, side effects of the proposed treatment, the likelihood of the patient achieving the goals of the procedure, and any potential problems that might occur during the procedure or recuperation. Informed consent has been obtained.  Description of procedure:  The patient was taken to the operating room and general anesthesia was induced.  The patient was placed in the dorsal lithotomy position, prepped and draped in the usual sterile fashion, and preoperative antibiotics were administered. A preoperative time-out was performed.   Cystourethroscopy was performed.  The patient's urethra was examined and demonstrated bilobar prostatic hypertrophy. The bladder was then systematically examined in its entirety. There was no evidence for any bladder tumors, stones, or other mucosal pathology.     Attention then turned to the rightureteral orifice and a ureteral catheter was used to intubate the ureteral orifice.  Omnipaque contrast was injected through the ureteral catheter and a retrograde pyelogram was performed with findings as dictated above.  A 0.38 sensor guidewire was then advanced up the right ureter into the renal pelvis under fluoroscopic guidance.  The wire was then backloaded through the cystoscope and a ureteral stent was advance over the wire using Seldinger technique.  The stent was positioned appropriately under fluoroscopic and cystoscopic guidance.  The wire was then removed with an adequate stent curl noted in the renal pelvis as well as in the bladder.  The bladder was then emptied and the procedure ended.  The patient appeared to tolerate the procedure well and without complications.  The patient was able to be awakened and transferred to the recovery unit in satisfactory condition.    Ardis Hughs, M.D.

## 2016-03-10 NOTE — Anesthesia Procedure Notes (Signed)
Procedure Name: Intubation Date/Time: 03/10/2016 6:38 PM Performed by: Maxwell Caul Pre-anesthesia Checklist: Patient identified, Emergency Drugs available, Suction available and Patient being monitored Patient Re-evaluated:Patient Re-evaluated prior to inductionOxygen Delivery Method: Circle system utilized Preoxygenation: Pre-oxygenation with 100% oxygen Intubation Type: IV induction, Rapid sequence and Cricoid Pressure applied Grade View: Grade I Tube type: Oral Tube size: 7.5 mm Number of attempts: 1 Airway Equipment and Method: Stylet and Oral airway Placement Confirmation: ETT inserted through vocal cords under direct vision,  positive ETCO2 and breath sounds checked- equal and bilateral Secured at: 21 cm Tube secured with: Tape Dental Injury: Teeth and Oropharynx as per pre-operative assessment
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
## 2016-03-10 NOTE — ED Notes (Signed)
Bed: WA09 Expected date:  Expected time:  Means of arrival:  Comments: EMS-dysuria

## 2016-03-10 NOTE — ED Triage Notes (Addendum)
BIB EMS from Home reporting right sided flank pain, urinary retention since May and has been unable to void since yesterday. Pt also reports HA x1 week described as "electrical" and metallic taste mouth. Pt is deaf and communicates via written language.  EMS Vitals BP 146/100 P 92 SPO2 98% RR 16

## 2016-03-11 ENCOUNTER — Encounter (HOSPITAL_COMMUNITY): Payer: Self-pay | Admitting: Urology

## 2016-03-11 DIAGNOSIS — N23 Unspecified renal colic: Secondary | ICD-10-CM | POA: Diagnosis not present

## 2016-03-11 DIAGNOSIS — R338 Other retention of urine: Secondary | ICD-10-CM | POA: Diagnosis not present

## 2016-03-11 DIAGNOSIS — N132 Hydronephrosis with renal and ureteral calculous obstruction: Secondary | ICD-10-CM | POA: Diagnosis not present

## 2016-03-11 DIAGNOSIS — N201 Calculus of ureter: Secondary | ICD-10-CM | POA: Diagnosis not present

## 2016-03-11 DIAGNOSIS — R112 Nausea with vomiting, unspecified: Secondary | ICD-10-CM | POA: Diagnosis not present

## 2016-03-11 DIAGNOSIS — R1031 Right lower quadrant pain: Secondary | ICD-10-CM | POA: Diagnosis not present

## 2016-03-11 LAB — BASIC METABOLIC PANEL
Anion gap: 8 (ref 5–15)
BUN: 34 mg/dL — AB (ref 6–20)
CHLORIDE: 103 mmol/L (ref 101–111)
CO2: 29 mmol/L (ref 22–32)
Calcium: 9.9 mg/dL (ref 8.9–10.3)
Creatinine, Ser: 1.92 mg/dL — ABNORMAL HIGH (ref 0.61–1.24)
GFR calc Af Amer: 44 mL/min — ABNORMAL LOW (ref >60)
GFR calc non Af Amer: 38 mL/min — ABNORMAL LOW (ref >60)
Glucose, Bld: 173 mg/dL — ABNORMAL HIGH (ref 65–99)
POTASSIUM: 4.3 mmol/L (ref 3.5–5.1)
SODIUM: 140 mmol/L (ref 135–145)

## 2016-03-11 LAB — GLUCOSE, CAPILLARY: Glucose-Capillary: 164 mg/dL — ABNORMAL HIGH (ref 65–99)

## 2016-03-11 LAB — CBC
HEMATOCRIT: 34.9 % — AB (ref 39.0–52.0)
Hemoglobin: 11.9 g/dL — ABNORMAL LOW (ref 13.0–17.0)
MCH: 27.4 pg (ref 26.0–34.0)
MCHC: 34.1 g/dL (ref 30.0–36.0)
MCV: 80.4 fL (ref 78.0–100.0)
Platelets: 402 10*3/uL — ABNORMAL HIGH (ref 150–400)
RBC: 4.34 MIL/uL (ref 4.22–5.81)
RDW: 12.7 % (ref 11.5–15.5)
WBC: 10.7 10*3/uL — AB (ref 4.0–10.5)

## 2016-03-11 LAB — URINE CULTURE: Culture: NO GROWTH

## 2016-03-11 MED ORDER — MORPHINE SULFATE (PF) 2 MG/ML IV SOLN
2.0000 mg | INTRAVENOUS | Status: DC | PRN
  Administered 2016-03-11 (×2): 2 mg via INTRAVENOUS
  Filled 2016-03-11 (×2): qty 1

## 2016-03-11 MED ORDER — KETOROLAC TROMETHAMINE 30 MG/ML IJ SOLN
30.0000 mg | Freq: Four times a day (QID) | INTRAMUSCULAR | Status: DC | PRN
  Administered 2016-03-11 (×2): 30 mg via INTRAVENOUS
  Filled 2016-03-11 (×2): qty 1

## 2016-03-11 MED ORDER — HYDROCORTISONE 2.5 % RE CREA
1.0000 "application " | TOPICAL_CREAM | Freq: Four times a day (QID) | RECTAL | Status: DC | PRN
  Administered 2016-03-11: 1 via TOPICAL
  Filled 2016-03-11: qty 28.35
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
## 2016-03-11 NOTE — Discharge Instructions (Signed)
DISCHARGE INSTRUCTIONS FOR KIDNEY STONE/URETERAL STENT   MEDICATIONS:  1.  Resume all your other meds from home - except do not take any extra narcotic pain meds that you may have at home.   ACTIVITY:  1. No strenuous activity x 1week  2. No driving while on narcotic pain medications  3. Drink plenty of water  4. Continue to walk at home - you can still get blood clots when you are at home, so keep active, but don't over do it.  5. May return to work/school tomorrow or when you feel ready   BATHING:  1. You can shower and we recommend daily showers    SIGNS/SYMPTOMS TO CALL:  Please call us if you have a fever greater than 101.5, uncontrolled nausea/vomiting, uncontrolled pain, dizziness, unable to urinate, bloody urine, chest pain, shortness of breath, leg swelling, leg pain, redness around wound, drainage from wound, or any other concerns or questions.   You can reach [place] at [phone].   FOLLOW-UP:  1. You will be contracted for an appointment in the coming days by Dr. Carlton Adam office.

## 2016-03-24 DIAGNOSIS — N201 Calculus of ureter: Secondary | ICD-10-CM | POA: Diagnosis not present

## 2016-03-24 DIAGNOSIS — D3000 Benign neoplasm of unspecified kidney: Secondary | ICD-10-CM | POA: Diagnosis not present

## 2016-03-25 DIAGNOSIS — Z45321 Encounter for adjustment and management of cochlear device: Secondary | ICD-10-CM | POA: Diagnosis not present

## 2016-03-25 DIAGNOSIS — H903 Sensorineural hearing loss, bilateral: Secondary | ICD-10-CM | POA: Diagnosis not present

## 2016-03-26 ENCOUNTER — Other Ambulatory Visit: Payer: Self-pay | Admitting: Urology

## 2016-04-07 NOTE — Patient Instructions (Addendum)
[name]  04/07/2016   Your procedure is scheduled on: 04/10/16  Report to [name] Main  Entrance take [name]  elevators to 3rd floor to  [name] at  0900 AM.  Call this number if you have problems the morning of surgery [phone]   Remember: ONLY 1 PERSON MAY GO WITH YOU TO SHORT STAY TO GET  READY MORNING OF YOUR SURGERY.  Do not eat food or drink liquids :After Midnight.     Take these medicines the morning of surgery with A SIP OF WATER: Tamulosin May take Tramadol if needed DO NOT TAKE ANY DIABETIC MEDICATIONS DAY OF YOUR SURGERY                               You may not have any metal on your body including hair pins and              piercings  Do not wear jewelry, make-up, lotions, powders or perfumes, deodorant             Do not wear nail polish.  Do not shave  48 hours prior to surgery.              Men may shave face and neck.   Do not bring valuables to the hospital. [name].  Contacts, dentures or bridgework may not be worn into surgery.  Leave suitcase in the car. After surgery it may be brought to your room.     Patients discharged the day of surgery will not be allowed to drive home.  Name and phone number of your driver: STATES HAS NO ONE--NOTIFIED [name] AT [name] RELIES ON TAXI  Special Instructions: N/A              Please read over the following fact sheets you were given: _____________________________________________________________________             [name] - Preparing for Surgery Before surgery, you can play an important role.  Because skin is not sterile, your skin needs to be as free of germs as possible.  You can reduce the number of germs on your skin by washing with CHG (chlorahexidine gluconate) soap before surgery.  CHG is an antiseptic cleaner which kills germs and bonds with the skin to continue killing germs  even after washing. Please DO NOT use if you have an allergy to CHG or antibacterial soaps.  If your skin becomes reddened/irritated stop using the CHG and inform your nurse when you arrive at Short Stay. Do not shave (including legs and underarms) for at least 48 hours prior to the first CHG shower.  You may shave your face/neck. Please follow these instructions carefully:  1.  Shower with CHG Soap the night before surgery and the  morning of Surgery.  2.  If you choose to wash your hair, wash your hair first as usual with your  normal  shampoo.  3.  After you shampoo, rinse your hair and body thoroughly to remove the  shampoo.                           4.  Use  CHG as you would any other liquid soap.  You can apply chg directly  to the skin and wash                       Gently with a scrungie or clean washcloth.  5.  Apply the CHG Soap to your body ONLY FROM THE NECK DOWN.   Do not use on face/ open                           Wound or open sores. Avoid contact with eyes, ears mouth and genitals (private parts).                       Wash face,  Genitals (private parts) with your normal soap.             6.  Wash thoroughly, paying special attention to the area where your surgery  will be performed.  7.  Thoroughly rinse your body with warm water from the neck down.  8.  DO NOT shower/wash with your normal soap after using and rinsing off  the CHG Soap.                9.  Pat yourself dry with a clean towel.            10.  Wear clean pajamas.            11.  Place clean sheets on your bed the night of your first shower and do not  sleep with pets. Day of Surgery : Do not apply any lotions/deodorants the morning of surgery.  Please wear clean clothes to the hospital/surgery center.  FAILURE TO FOLLOW THESE INSTRUCTIONS MAY RESULT IN THE CANCELLATION OF YOUR SURGERY PATIENT SIGNATURE_________________________________  NURSE  SIGNATURE__________________________________  ________________________________________________________________________

## 2016-04-08 ENCOUNTER — Encounter (HOSPITAL_COMMUNITY): Payer: Self-pay

## 2016-04-08 ENCOUNTER — Encounter (HOSPITAL_COMMUNITY)
Admission: RE | Admit: 2016-04-08 | Discharge: 2016-04-08 | Disposition: A | Discharge status: Home / Self Care | Payer: Medicare Other | Source: Ambulatory Visit | Attending: Urology | Admitting: Urology

## 2016-04-08 DIAGNOSIS — K5900 Constipation, unspecified: Secondary | ICD-10-CM

## 2016-04-08 DIAGNOSIS — K625 Hemorrhage of anus and rectum: Secondary | ICD-10-CM

## 2016-04-08 DIAGNOSIS — N2889 Other specified disorders of kidney and ureter: Secondary | ICD-10-CM

## 2016-04-08 DIAGNOSIS — F17200 Nicotine dependence, unspecified, uncomplicated: Secondary | ICD-10-CM | POA: Insufficient documentation

## 2016-04-08 DIAGNOSIS — Z01810 Encounter for preprocedural cardiovascular examination: Secondary | ICD-10-CM | POA: Insufficient documentation

## 2016-04-08 DIAGNOSIS — Z01812 Encounter for preprocedural laboratory examination: Secondary | ICD-10-CM

## 2016-04-08 DIAGNOSIS — H905 Unspecified sensorineural hearing loss: Secondary | ICD-10-CM | POA: Diagnosis not present

## 2016-04-08 DIAGNOSIS — D649 Anemia, unspecified: Secondary | ICD-10-CM | POA: Diagnosis not present

## 2016-04-08 DIAGNOSIS — N202 Calculus of kidney with calculus of ureter: Secondary | ICD-10-CM | POA: Diagnosis not present

## 2016-04-08 DIAGNOSIS — K921 Melena: Secondary | ICD-10-CM

## 2016-04-08 DIAGNOSIS — N200 Calculus of kidney: Secondary | ICD-10-CM | POA: Insufficient documentation

## 2016-04-08 DIAGNOSIS — Z885 Allergy status to narcotic agent status: Secondary | ICD-10-CM | POA: Diagnosis not present

## 2016-04-08 DIAGNOSIS — M1990 Unspecified osteoarthritis, unspecified site: Secondary | ICD-10-CM | POA: Diagnosis not present

## 2016-04-08 DIAGNOSIS — F329 Major depressive disorder, single episode, unspecified: Secondary | ICD-10-CM | POA: Diagnosis not present

## 2016-04-08 DIAGNOSIS — D62 Acute posthemorrhagic anemia: Secondary | ICD-10-CM

## 2016-04-08 DIAGNOSIS — J3489 Other specified disorders of nose and nasal sinuses: Secondary | ICD-10-CM

## 2016-04-08 DIAGNOSIS — K644 Residual hemorrhoidal skin tags: Secondary | ICD-10-CM

## 2016-04-08 DIAGNOSIS — K219 Gastro-esophageal reflux disease without esophagitis: Secondary | ICD-10-CM | POA: Diagnosis not present

## 2016-04-08 DIAGNOSIS — R338 Other retention of urine: Secondary | ICD-10-CM

## 2016-04-08 DIAGNOSIS — F17210 Nicotine dependence, cigarettes, uncomplicated: Secondary | ICD-10-CM | POA: Diagnosis not present

## 2016-04-08 DIAGNOSIS — M4802 Spinal stenosis, cervical region: Secondary | ICD-10-CM | POA: Diagnosis not present

## 2016-04-08 DIAGNOSIS — I10 Essential (primary) hypertension: Secondary | ICD-10-CM

## 2016-04-08 HISTORY — DX: Personal history of urinary calculi: Z87.442

## 2016-04-08 HISTORY — DX: Essential (primary) hypertension: I10

## 2016-04-08 HISTORY — DX: Heartburn: R12

## 2016-04-08 LAB — BASIC METABOLIC PANEL
ANION GAP: 6 (ref 5–15)
BUN: 19 mg/dL (ref 6–20)
CALCIUM: 9.5 mg/dL (ref 8.9–10.3)
CO2: 27 mmol/L (ref 22–32)
CREATININE: 1.15 mg/dL (ref 0.61–1.24)
Chloride: 106 mmol/L (ref 101–111)
GFR calc non Af Amer: >60 mL/min (ref >60)
Glucose, Bld: 91 mg/dL (ref 65–99)
Potassium: 4.4 mmol/L (ref 3.5–5.1)
SODIUM: 139 mmol/L (ref 135–145)

## 2016-04-08 LAB — CBC
HCT: 38.6 % — ABNORMAL LOW (ref 39.0–52.0)
HEMOGLOBIN: 12.9 g/dL — AB (ref 13.0–17.0)
MCH: 27.4 pg (ref 26.0–34.0)
MCHC: 33.4 g/dL (ref 30.0–36.0)
MCV: 82 fL (ref 78.0–100.0)
PLATELETS: 249 10*3/uL (ref 150–400)
RBC: 4.71 MIL/uL (ref 4.22–5.81)
RDW: 14.8 % (ref 11.5–15.5)
WBC: 7.8 10*3/uL (ref 4.0–10.5)

## 2016-04-09 NOTE — Progress Notes (Signed)
Interpretor present for entire visit-- Zenia Resides from [facility]

## 2016-04-10 ENCOUNTER — Ambulatory Visit (HOSPITAL_COMMUNITY): Payer: Medicare Other | Admitting: Certified Registered Nurse Anesthetist

## 2016-04-10 ENCOUNTER — Encounter (HOSPITAL_COMMUNITY): Payer: Self-pay | Admitting: Certified Registered Nurse Anesthetist

## 2016-04-10 ENCOUNTER — Observation Stay (HOSPITAL_COMMUNITY)
Admission: RE | Admit: 2016-04-10 | Discharge: 2016-04-11 | Disposition: A | Discharge status: Home / Self Care | Payer: Medicare Other | Source: Ambulatory Visit | Attending: Urology | Admitting: Urology

## 2016-04-10 ENCOUNTER — Encounter (HOSPITAL_COMMUNITY)
Admission: RE | Disposition: A | Discharge status: Home / Self Care | Payer: Self-pay | Source: Ambulatory Visit | Attending: Urology

## 2016-04-10 DIAGNOSIS — N200 Calculus of kidney: Secondary | ICD-10-CM

## 2016-04-10 DIAGNOSIS — N202 Calculus of kidney with calculus of ureter: Secondary | ICD-10-CM | POA: Diagnosis not present

## 2016-04-10 DIAGNOSIS — F17210 Nicotine dependence, cigarettes, uncomplicated: Secondary | ICD-10-CM | POA: Insufficient documentation

## 2016-04-10 DIAGNOSIS — M1990 Unspecified osteoarthritis, unspecified site: Secondary | ICD-10-CM | POA: Diagnosis not present

## 2016-04-10 DIAGNOSIS — K219 Gastro-esophageal reflux disease without esophagitis: Secondary | ICD-10-CM | POA: Insufficient documentation

## 2016-04-10 DIAGNOSIS — Z885 Allergy status to narcotic agent status: Secondary | ICD-10-CM | POA: Diagnosis not present

## 2016-04-10 DIAGNOSIS — D649 Anemia, unspecified: Secondary | ICD-10-CM | POA: Insufficient documentation

## 2016-04-10 DIAGNOSIS — H905 Unspecified sensorineural hearing loss: Secondary | ICD-10-CM | POA: Diagnosis not present

## 2016-04-10 DIAGNOSIS — N201 Calculus of ureter: Secondary | ICD-10-CM | POA: Diagnosis not present

## 2016-04-10 DIAGNOSIS — K5900 Constipation, unspecified: Secondary | ICD-10-CM | POA: Insufficient documentation

## 2016-04-10 DIAGNOSIS — F329 Major depressive disorder, single episode, unspecified: Secondary | ICD-10-CM | POA: Diagnosis not present

## 2016-04-10 DIAGNOSIS — Z466 Encounter for fitting and adjustment of urinary device: Secondary | ICD-10-CM | POA: Diagnosis not present

## 2016-04-10 DIAGNOSIS — M4802 Spinal stenosis, cervical region: Secondary | ICD-10-CM | POA: Insufficient documentation

## 2016-04-10 DIAGNOSIS — I10 Essential (primary) hypertension: Secondary | ICD-10-CM | POA: Insufficient documentation

## 2016-04-10 HISTORY — PX: CYSTOSCOPY/URETEROSCOPY/HOLMIUM LASER/STENT PLACEMENT: SHX6546

## 2016-04-10 SURGERY — CYSTOSCOPY/URETEROSCOPY/HOLMIUM LASER/STENT PLACEMENT
Anesthesia: General | Laterality: Right

## 2016-04-10 MED ORDER — TAMSULOSIN HCL 0.4 MG PO CAPS
0.4000 mg | ORAL_CAPSULE | Freq: Two times a day (BID) | ORAL | Status: DC
  Administered 2016-04-10 – 2016-04-11 (×2): 0.4 mg via ORAL
  Filled 2016-04-10 (×2): qty 1

## 2016-04-10 MED ORDER — PROMETHAZINE HCL 25 MG/ML IJ SOLN
INTRAMUSCULAR | Status: AC
  Filled 2016-04-10: qty 1

## 2016-04-10 MED ORDER — FENTANYL CITRATE (PF) 100 MCG/2ML IJ SOLN
INTRAMUSCULAR | Status: AC
  Filled 2016-04-10: qty 2

## 2016-04-10 MED ORDER — DIAZEPAM 5 MG PO TABS
5.0000 mg | ORAL_TABLET | Freq: Once | ORAL | Status: AC
  Administered 2016-04-10: 5 mg via ORAL
  Filled 2016-04-10: qty 1

## 2016-04-10 MED ORDER — MIDAZOLAM HCL 2 MG/2ML IJ SOLN
INTRAMUSCULAR | Status: AC
  Filled 2016-04-10: qty 2

## 2016-04-10 MED ORDER — PROPOFOL 10 MG/ML IV BOLUS
INTRAVENOUS | Status: AC
  Filled 2016-04-10: qty 20

## 2016-04-10 MED ORDER — ZOLPIDEM TARTRATE 5 MG PO TABS: 5.0000 mg | ORAL_TABLET | Freq: Every evening | ORAL | Status: DC | PRN

## 2016-04-10 MED ORDER — ONDANSETRON HCL 4 MG PO TABS
4.0000 mg | ORAL_TABLET | Freq: Four times a day (QID) | ORAL | Status: DC
  Administered 2016-04-10 – 2016-04-11 (×3): 4 mg via ORAL
  Filled 2016-04-10 (×3): qty 1

## 2016-04-10 MED ORDER — CIPROFLOXACIN IN D5W 400 MG/200ML IV SOLN
400.0000 mg | INTRAVENOUS | Status: AC
  Administered 2016-04-10: 400 mg via INTRAVENOUS

## 2016-04-10 MED ORDER — TRAMADOL HCL 50 MG PO TABS: 50.0000 mg | ORAL_TABLET | Freq: Four times a day (QID) | ORAL | 0 refills | Status: DC

## 2016-04-10 MED ORDER — METHOCARBAMOL 500 MG PO TABS
500.0000 mg | ORAL_TABLET | Freq: Three times a day (TID) | ORAL | Status: DC | PRN
  Administered 2016-04-10: 500 mg via ORAL
  Filled 2016-04-10: qty 1

## 2016-04-10 MED ORDER — MIDAZOLAM HCL 5 MG/5ML IJ SOLN
INTRAMUSCULAR | Status: DC | PRN
  Administered 2016-04-10: 2 mg via INTRAVENOUS

## 2016-04-10 MED ORDER — IOHEXOL 300 MG/ML  SOLN
INTRAMUSCULAR | Status: DC | PRN
  Administered 2016-04-10: 17 mL via URETHRAL

## 2016-04-10 MED ORDER — LISINOPRIL 10 MG PO TABS
5.0000 mg | ORAL_TABLET | Freq: Every day | ORAL | Status: DC
  Administered 2016-04-11: 5 mg via ORAL
  Filled 2016-04-10: qty 1

## 2016-04-10 MED ORDER — SODIUM CHLORIDE 0.45 % IV SOLN
INTRAVENOUS | Status: DC
  Administered 2016-04-10 – 2016-04-11 (×3): via INTRAVENOUS

## 2016-04-10 MED ORDER — LIDOCAINE HCL 2 % EX GEL
CUTANEOUS | Status: AC
Start: 2016-04-10 — End: 2016-04-10
  Filled 2016-04-10: qty 5

## 2016-04-10 MED ORDER — LIDOCAINE HCL (CARDIAC) 20 MG/ML IV SOLN
INTRAVENOUS | Status: DC | PRN
  Administered 2016-04-10: 100 mg via INTRAVENOUS

## 2016-04-10 MED ORDER — SODIUM CHLORIDE 0.9 % IR SOLN
Status: DC | PRN
  Administered 2016-04-10: 5000 mL via INTRAVESICAL

## 2016-04-10 MED ORDER — TAMSULOSIN HCL 0.4 MG PO CAPS: 0.4000 mg | ORAL_CAPSULE | Freq: Two times a day (BID) | ORAL | Status: DC

## 2016-04-10 MED ORDER — IBUPROFEN 200 MG PO TABS
600.0000 mg | ORAL_TABLET | Freq: Four times a day (QID) | ORAL | Status: DC | PRN
  Administered 2016-04-10: 600 mg via ORAL
  Filled 2016-04-10: qty 3

## 2016-04-10 MED ORDER — BELLADONNA ALKALOIDS-OPIUM 16.2-60 MG RE SUPP
RECTAL | Status: AC
  Filled 2016-04-10: qty 1

## 2016-04-10 MED ORDER — ONDANSETRON HCL 4 MG/2ML IJ SOLN
INTRAMUSCULAR | Status: AC
  Filled 2016-04-10: qty 2

## 2016-04-10 MED ORDER — 0.9 % SODIUM CHLORIDE (POUR BTL) OPTIME
TOPICAL | Status: DC | PRN
  Administered 2016-04-10: 1000 mL

## 2016-04-10 MED ORDER — KETOROLAC TROMETHAMINE 30 MG/ML IJ SOLN
15.0000 mg | Freq: Four times a day (QID) | INTRAMUSCULAR | Status: DC | PRN
  Administered 2016-04-10 – 2016-04-11 (×2): 15 mg via INTRAVENOUS
  Filled 2016-04-10 (×2): qty 1

## 2016-04-10 MED ORDER — PROPOFOL 10 MG/ML IV BOLUS
INTRAVENOUS | Status: DC | PRN
  Administered 2016-04-10: 200 mg via INTRAVENOUS

## 2016-04-10 MED ORDER — ONDANSETRON HCL 4 MG/2ML IJ SOLN
INTRAMUSCULAR | Status: DC | PRN
  Administered 2016-04-10: 4 mg via INTRAVENOUS

## 2016-04-10 MED ORDER — LACTATED RINGERS IV SOLN
INTRAVENOUS | Status: DC
  Administered 2016-04-10 (×2): via INTRAVENOUS

## 2016-04-10 MED ORDER — URIBEL 118 MG PO CAPS: 1.0000 | ORAL_CAPSULE | Freq: Four times a day (QID) | ORAL | 0 refills | Status: DC

## 2016-04-10 MED ORDER — HYDROMORPHONE HCL 2 MG/ML IJ SOLN: 0.2500 mg | INTRAMUSCULAR | Status: DC | PRN

## 2016-04-10 MED ORDER — FENTANYL CITRATE (PF) 100 MCG/2ML IJ SOLN
INTRAMUSCULAR | Status: DC | PRN
Start: 2016-04-10 — End: 2016-04-10
  Administered 2016-04-10 (×2): 50 ug via INTRAVENOUS

## 2016-04-10 MED ORDER — LIDOCAINE 2% (20 MG/ML) 5 ML SYRINGE
INTRAMUSCULAR | Status: AC
  Filled 2016-04-10: qty 5

## 2016-04-10 MED ORDER — FENTANYL CITRATE (PF) 100 MCG/2ML IJ SOLN
25.0000 ug | INTRAMUSCULAR | Status: DC | PRN
  Administered 2016-04-11 (×2): 25 ug via INTRAVENOUS
  Filled 2016-04-10 (×3): qty 2

## 2016-04-10 MED ORDER — DEXAMETHASONE SODIUM PHOSPHATE 10 MG/ML IJ SOLN
INTRAMUSCULAR | Status: AC
  Filled 2016-04-10: qty 1

## 2016-04-10 MED ORDER — LIDOCAINE HCL 2 % EX GEL
CUTANEOUS | Status: DC | PRN
  Administered 2016-04-10: 1 via URETHRAL

## 2016-04-10 MED ORDER — PROMETHAZINE HCL 25 MG/ML IJ SOLN
6.2500 mg | INTRAMUSCULAR | Status: DC | PRN
  Administered 2016-04-10: 6.25 mg via INTRAVENOUS

## 2016-04-10 MED ORDER — TRAMADOL HCL 50 MG PO TABS
50.0000 mg | ORAL_TABLET | Freq: Four times a day (QID) | ORAL | Status: DC
  Administered 2016-04-10 – 2016-04-11 (×3): 100 mg via ORAL
  Filled 2016-04-10 (×3): qty 2

## 2016-04-10 MED ORDER — DEXAMETHASONE SODIUM PHOSPHATE 10 MG/ML IJ SOLN
INTRAMUSCULAR | Status: DC | PRN
  Administered 2016-04-10: 10 mg via INTRAVENOUS

## 2016-04-10 MED ORDER — HYDROMORPHONE HCL 2 MG/ML IJ SOLN
INTRAMUSCULAR | Status: AC
  Filled 2016-04-10: qty 1

## 2016-04-10 MED ORDER — FENTANYL CITRATE (PF) 100 MCG/2ML IJ SOLN
25.0000 ug | INTRAMUSCULAR | Status: DC | PRN
  Administered 2016-04-10 (×3): 50 ug via INTRAVENOUS

## 2016-04-10 MED ORDER — CIPROFLOXACIN IN D5W 400 MG/200ML IV SOLN
INTRAVENOUS | Status: AC
  Filled 2016-04-10: qty 200

## 2016-04-10 MED ORDER — BELLADONNA ALKALOIDS-OPIUM 16.2-60 MG RE SUPP
RECTAL | Status: DC | PRN
  Administered 2016-04-10: 1 via RECTAL

## 2016-04-10 MED ORDER — HYDROMORPHONE HCL 2 MG/ML IJ SOLN
0.2500 mg | INTRAMUSCULAR | Status: DC | PRN
  Administered 2016-04-10 (×4): 0.5 mg via INTRAVENOUS

## 2016-04-10 SURGICAL SUPPLY — 22 items
BAG URO CATCHER STRL LF (MISCELLANEOUS) ×3 IMPLANT
BASKET DAKOTA 1.9FR 11X120 (BASKET) IMPLANT
BASKET STONE NCOMPASS (UROLOGICAL SUPPLIES) ×2 IMPLANT
BASKET ZERO TIP NITINOL 2.4FR (BASKET) IMPLANT
BSKT STON RTRVL ZERO TP 2.4FR (BASKET)
CATH URET 5FR 28IN OPEN ENDED (CATHETERS) ×3 IMPLANT
CLOTH BEACON ORANGE TIMEOUT ST (SAFETY) ×3 IMPLANT
EXTRACTOR STONE NITINOL NGAGE (UROLOGICAL SUPPLIES) ×2 IMPLANT
FIBER LASER TRAC TIP (UROLOGICAL SUPPLIES) ×2 IMPLANT
GLOVE BIOGEL M STRL SZ7.5 (GLOVE) ×3 IMPLANT
GOWN STRL REUS W/TWL XL LVL3 (GOWN DISPOSABLE) ×3 IMPLANT
GUIDEWIRE ANG ZIPWIRE 038X150 (WIRE) IMPLANT
GUIDEWIRE STR DUAL SENSOR (WIRE) ×5 IMPLANT
MANIFOLD NEPTUNE II (INSTRUMENTS) ×3 IMPLANT
PACK CYSTO (CUSTOM PROCEDURE TRAY) ×3 IMPLANT
SHEATH ACCESS URETERAL 24CM (SHEATH) IMPLANT
SHEATH ACCESS URETERAL 38CM (SHEATH) ×2 IMPLANT
SHEATH ACCESS URETERAL 54CM (SHEATH) IMPLANT
STENT POLARIS 5FRX28 (STENTS) ×2 IMPLANT
SYRINGE IRR TOOMEY STRL 70CC (SYRINGE) ×3 IMPLANT
TUBING CONNECTING 10 (TUBING) ×2 IMPLANT
TUBING CONNECTING 10' (TUBING) ×1

## 2016-04-10 NOTE — Anesthesia Preprocedure Evaluation (Addendum)
Anesthesia Evaluation  Patient identified by MRN, date of birth, ID band Patient awake    Reviewed: Allergy & Precautions, NPO status , Patient's Chart, lab work & pertinent test results  History of Anesthesia Complications Negative for: history of anesthetic complications  Airway Mallampati: II  TM Distance: >3 FB Neck ROM: Full    Dental  (+) Teeth Intact, Dental Advisory Given   Pulmonary Current Smoker,    Pulmonary exam normal breath sounds clear to auscultation       Cardiovascular hypertension, Pt. on medications Normal cardiovascular exam Rhythm:Regular Rate:Normal     Neuro/Psych PSYCHIATRIC DISORDERS Depression Cervical spinal stenosis Congenital deafness   Neuromuscular disease    GI/Hepatic Neg liver ROS, GERD  ,  Endo/Other  negative endocrine ROS  Renal/GU Renal InsufficiencyRenal disease (AKI)Nephrolithiasis h/o renal mass     Musculoskeletal  (+) Arthritis , Osteoarthritis,    Abdominal   Peds  Hematology  (+) Blood dyscrasia, anemia ,   Anesthesia Other Findings Day of surgery medications reviewed with the patient.  Reproductive/Obstetrics                             Anesthesia Physical  Anesthesia Plan  ASA: II  Anesthesia Plan: General   Post-op Pain Management:    Induction: Intravenous  Airway Management Planned: LMA  Additional Equipment:   Intra-op Plan:   Post-operative Plan: Extubation in OR  Informed Consent: I have reviewed the patients History and Physical, chart, labs and discussed the procedure including the risks, benefits and alternatives for the proposed anesthesia with the patient or authorized representative who has indicated his/her understanding and acceptance.   Dental advisory given  Plan Discussed with: CRNA  Anesthesia Plan Comments: (Risks/benefits of general anesthesia discussed with patient including risk of damage to teeth,  lips, gum, and tongue, nausea/vomiting, allergic reactions to medications, and the possibility of heart attack, stroke and death.  All patient questions answered.  Patient wishes to proceed.)       Anesthesia Quick Evaluation

## 2016-04-10 NOTE — Plan of Care (Signed)
Problem: Safety: Goal: Ability to remain free from injury will improve Outcome: Completed/Met Date Met: 04/10/16 Discussed safety prevention plan with pt. Face to face Interpreter used. Pt is aware to call when needing to get oob. Bed alarm set

## 2016-04-10 NOTE — Progress Notes (Signed)
Patient c/o uncontrolled pain. V.o received from provider to give Toradol 15 mg IV q 6 hours prn mild pain and Fentanyl 25 mcg every 2 hours prn for severe pain. Will continue to monitor

## 2016-04-10 NOTE — Transfer of Care (Signed)
Immediate Anesthesia Transfer of Care Note  Patient: [name]  Procedure(s) Performed: Procedure(s): CYSTOSCOPY/URETEROSCOPY/STENT EXCHANGE/ RETROGRADE PYELOGRAM (Right)  Patient Location: PACU  Anesthesia Type:General  Level of Consciousness: awake, oriented, patient cooperative, lethargic and responds to stimulation  Airway & Oxygen Therapy: Patient Spontanous Breathing and Patient connected to face mask oxygen  Post-op Assessment: Report given to RN, Post -op Vital signs reviewed and stable and Patient moving all extremities  Post vital signs: Reviewed and stable  Last Vitals:  Vitals:   04/10/16 0857  BP: 126/79  Pulse: 90  Resp: 16  Temp: 36.6 C    Last Pain:  Vitals:   04/10/16 0857  TempSrc: Oral      Patients Stated Pain Goal: 3 ([id number])  Complications: No apparent anesthesia complications

## 2016-04-10 NOTE — Anesthesia Postprocedure Evaluation (Signed)
Anesthesia Post Note  Patient: [name]  Procedure(s) Performed: Procedure(s) (LRB): CYSTOSCOPY/URETEROSCOPY/STENT EXCHANGE/ RETROGRADE PYELOGRAM (Right)  Patient location during evaluation: PACU Anesthesia Type: General Level of consciousness: awake and alert Pain management: pain level controlled Vital Signs Assessment: post-procedure vital signs reviewed and stable Respiratory status: spontaneous breathing, nonlabored ventilation, respiratory function stable and patient connected to nasal cannula oxygen Cardiovascular status: blood pressure returned to baseline and stable Postop Assessment: no signs of nausea or vomiting Anesthetic complications: no    Last Vitals:  Vitals:   04/10/16 0857 04/10/16 1234  BP: 126/79 119/79  Pulse: 90 68  Resp: 16 14  Temp: 36.6 C 36.7 C    Last Pain:  Vitals:   04/10/16 0857  TempSrc: Oral                 Catalina Gravel

## 2016-04-10 NOTE — H&P (Signed)
f/u for obstructing stone  HPI: [name] is a 55 year-old male established patient who is here for further eval and management of an obstructing stone.  The patient's stone is on his right side. The stone was 81mm right distal ureter. There are additional stones within the urinary tract. They are located Right lower pole.   The patient has not passed their stone since his visit.   The patient is status post right ureteral stent placement on 03/10/16. He has tolerated the stent poorly. He is taking Flomax but complains of flank pain as well as bladder pain upon urination. He is taking tramadol for his pain as well. He also complains of severe constipation resulting in hemorrhoids. He denies any fevers or chills. He has had some hematuria.     CC: Renal Mass Surveillance  HPI: The mass is on the right side.   The lesion(s) was first noted on 09/22/2015. The mass was seen on CT Scan.   The mass was last imaged 2 weeks. The patient recently underwent a CT scan to re-evaluate the lesion in question. .   He has had previous abdominal surgery. His past surgical history includes: Cholecystectomy. The patient can walk a flight of steps.   The patient denies history of diabetes, heart attack or stroke. There is not a a family history of kidney cancer. There is no family history of brain tumors (AMLs), seizures or brain aneurysm's.   The patient is deaf. He is a smoker.     ALLERGIES: Hydrocodone Vicodin TABS    MEDICATIONS: Ultram 50 mg tablet 1-2 tablet PO Q 6 H  BuPROPion HCl ER (SR) 150 MG Oral Tablet Extended Release 12 Hour Oral  Zantac 150 MG Oral Tablet Oral     GU PSH: Cysto Uretero Remove Stone - 2012 Cystoscopy Insert Stent, Right - 03/08/2016, 2012      PSH Notes: Cystoscopy With Ureteroscopy With Removal Of Calculus, Cystoscopy With Insertion Of Ureteral Stent Right, Hand Surgery, Cholecystectomy, Knee Surgery  rectal bleeding     NON-GU PSH: Cholecystectomy -  2010 Hand/finger Surgery    GU PMH: Kidney Stone, Right, 49mm in the right renal pelvis. - 01/02/2016 Kidney, Right, Neoplasm of uncertain behavior, Right, 4cm endophytic in the right lower pole. - 01/02/2016 Urinary Frequency - 01/02/2016 BPH w/LUTS, Benign prostatic hyperplasia with urinary obstruction - 2014 ED, arterial insufficiency, Erectile dysfunction due to arterial insufficiency - 2014 Personal Hx urinary calculi, Nephrolithiasis - 2014      PMH Notes:  2009-01-28 13:35:43 - Note: Congenital Deafness   NON-GU PMH: Personal history of other diseases of the digestive system, History of esophageal reflux - 2014 Depression Encounter for general adult medical examination without abnormal findings, Encounter for preventive health examination Hypertension    FAMILY HISTORY: Cancer - Mother Family Health Status Number - Runs In Family liver cancer - Mother rectal cancer - Mother   SOCIAL HISTORY: Marital Status: Single Drinks 1 caffeinated drink per day.     Notes: Caffeine Use, Marital History - Divorced, Occupation:, Tobacco Use, Alcohol Use   REVIEW OF SYSTEMS:    GU Review Male:   Patient denies frequent urination, have to strain to urinate , leakage of urine, hard to postpone urination, erection problems, trouble starting your stream, burning/ pain with urination, stream starts and stops, penile pain, and get up at night to urinate.  Gastrointestinal (Upper):   Patient denies nausea, vomiting, and indigestion/ heartburn.  Gastrointestinal (Lower):   Patient denies diarrhea and constipation.  Constitutional:   Patient denies fever, night sweats, weight loss, and fatigue.  Skin:   Patient denies skin rash/ lesion and itching.  Eyes:   Patient denies blurred vision and double vision.  Ears/ Nose/ Throat:   Patient denies sore throat and sinus problems.  Hematologic/Lymphatic:   Patient denies swollen glands and easy bruising.  Cardiovascular:   Patient denies leg swelling and  chest pains.  Respiratory:   Patient denies cough and shortness of breath.  Endocrine:   Patient denies excessive thirst.  Musculoskeletal:   Patient denies back pain and joint pain.  Neurological:   Patient denies headaches and dizziness.  Psychologic:   Patient denies depression and anxiety.   VITAL SIGNS:      03/24/2016 04:09 PM  Weight 178 lb / 80.74 kg  Height 72 in / 182.88 cm  BP 134/90 mmHg  Pulse 75 /min  BMI 24.1 kg/m   MULTI-SYSTEM PHYSICAL EXAMINATION:    Constitutional: Well-nourished. No physical deformities. Normally developed. Good grooming.  Neck: Neck symmetrical, not swollen. Normal tracheal position.  Respiratory: No labored breathing, no use of accessory muscles. Clear to auscultation  Cardiovascular: Normal temperature, normal extremity pulses, no swelling, no varicosities. Regular rate and rhythm  Lymphatic: No enlargement of neck, axillae, groin.  Skin: No paleness, no jaundice, no cyanosis. No lesion, no ulcer, no rash.  Neurologic / Psychiatric: Oriented to time, oriented to place, oriented to person. No depression, no anxiety, no agitation.  Gastrointestinal: No mass, no tenderness, no rigidity, non obese abdomen.  Eyes: Normal conjunctivae. Normal eyelids.  Ears, Nose, Mouth, and Throat: Left ear no scars, no lesions, no masses. Right ear no scars, no lesions, no masses. Nose no scars, no lesions, no masses. Normal hearing. Normal lips.  Musculoskeletal: Normal gait and station of head and neck.     PAST DATA REVIEWED:  Source Of History:  Patient  Records Review:   Previous Patient Records  X-Ray Review: C.T. Abdomen/Pelvis: Reviewed Films. Discussed With Patient. The patient has a right proximal ureteral stone that has been stented. He also has a complex lesion in the right lower pole that has been incompletely characterized on the imaging but has changed over the past 6 weeks between his imaging modalities.    01/28/09  PSA  Total PSA 0.86      PROCEDURES:          Urinalysis w/Scope - 81001 Dipstick Dipstick Cont'd Micro  Color: Yellow Bilirubin: Neg WBC/hpf: 10 - 20/hpf  Appearance: Cloudy Ketones: Neg RBC/hpf: 3 - 10/hpf  Specific Gravity: 1.025 Blood: 3+ Bacteria: Rare (0-9/hpf)  pH: <=5.0 Protein: 2+ Cystals: NS (Not Seen)  Glucose: Neg Urobilinogen: 0.2 Casts: Hyaline    Nitrites: Neg Trichomonas: Not Present    Leukocyte Esterase: 1+ Mucous: Present      Epithelial Cells: 0 - 5/hpf      Yeast: NS (Not Seen)      Sperm: Not Present    Notes:      ASSESSMENT:      ICD-10 Details  1 GU:   Calculus Ureter - N20.1   2   Benign Neo Kidney, Unspec - D30.00    PLAN:            Medications Refill Meds: Tamsulosin Hcl 0.4 mg capsule, ext release 24 hr 2 capsule PO Daily   #30  0 Refill(s)  Ultram 50 mg tablet 1-2 tablet PO Q 6 H   #45  0 Refill(s)    New Meds:  Colace 100 mg capsule 1 capsule PO BID   #60  5 Refill(s)  Ibuprofen 600 mg tablet 1 tablet PO Q 6 H PRN   #90  0 Refill(s)  Miralax 17 gram/dose powder 1 cap PO Daily 17gms (1 cap full) in 8 oz of water.  #238  1 Refill(s)            Document Letter(s):  Created for Patient: Clinical Summary    I went over the treatment options for their stone. We discussed ongoing medical expulsion therapy, ESWL and ureteroscopy. Ultimately the patient and I agreed that ureterscopy is the best option. I went over this surgery with the patient in detail. The patient understands after being put to sleep, we would proceed with a telescope to access the stone and potentially use a laser to fragment the stone before removing it with a basket. After removing the stone the patient will require temporary stent placement in the ureter. This is an outpatient procedure. I also discussed the potential of not being able to gain access safely into the ureter/kidney. This would require that a stent be placed and then the patient rescheduled several weeks later for a second attempt.  They also understand the small risks of ureteral trauma causing a stricture or permanent damage. I also explained the risk of urinary tract infection. Having gone over the procedure itself, the expected outcome, and the risks/benefits the patient has agreed to proceed.        Notes:   The patient has a right ureteral stone measuring 10-11 mm. He has had a stent now for 2 weeks. He has not had any ongoing fevers or chills but does have some stent discomfort. In addition, the patient has an interesting lesion in the lower pole Of his right kidney which has involved over the course of the last 6 weeks leading me to believe that this is less likely a carcinoma and may be an infectious process.   Our plan is to treat the patient's ureteral stone and then obtain better imaging of his kidney in the form of an MRI if possible to evaluate the renal lesion.

## 2016-04-10 NOTE — Progress Notes (Signed)
Pt main complaint is needing to void his bladder. Unable to void in urinal at this time.

## 2016-04-10 NOTE — Anesthesia Procedure Notes (Signed)
Procedure Name: LMA Insertion Date/Time: 04/10/2016 10:55 AM Performed by: Glory Buff Pre-anesthesia Checklist: Emergency Drugs available, Patient identified, Suction available and Patient being monitored Patient Re-evaluated:Patient Re-evaluated prior to inductionOxygen Delivery Method: Circle system utilized Preoxygenation: Pre-oxygenation with 100% oxygen Intubation Type: IV induction LMA: LMA with gastric port inserted LMA Size: 4.0 Number of attempts: 1 Placement Confirmation: positive ETCO2 Tube secured with: Tape Dental Injury: Teeth and Oropharynx as per pre-operative assessment

## 2016-04-10 NOTE — Progress Notes (Signed)
Patient c/o pain in bladder. Bladder scan completed 210 cc. Pt unable to void. Will contact provider

## 2016-04-10 NOTE — Progress Notes (Addendum)
Patient reports being unable to void. Pt attempted and a small amount of blood came out. Pt began to grimace and stated it hurts" Pt is deaf, face to face interpreter at bedside. Pt c/o pain. Bladder scan completed 180 cc. Will continue to monitor

## 2016-04-10 NOTE — Op Note (Signed)
Preoperative diagnosis:  1. Right ureteral stone   Postoperative diagnosis:  1. same   Procedure: 1. Cystoscopy, right retrograde pyelogram 2. Right ureteroscopy, basket/stone removal 3. Right ureteral stent exchange  Surgeon: Ardis Hughs, MD  Anesthesia: General  Complications: None  Intraoperative findings:  #1: Patient had a 10 mm soft stone in the right mid ureter which I was able to basket and remove, the stone broke up within the basket and did not require laser fragmentation.  #2: Right retrograde pyelogram demonstrated a filling defect in the right mid ureter. There is also a large dilated lower pole with several filling defects consistent with the patient's calyceal diverticulum. Within the lower pole moiety there were numerous large stones as well as small sediment. #3: A 28 cm times 5 French Polaris stent was left in the patient's right ureter. The case.  EBL: Minimal  Specimens: Right mid ureteral stone, this was taken to [facility] specialist for further stone analysis.  Indication: [name] is a 55 y.o. patient with a right mid ureteral stone, as well as a suspicious lower pole area that had been incompletely characterized on the patient's stone protocol CT scan.  After reviewing the management options for treatment, he elected to proceed with the above surgical procedure(s). We have discussed the potential benefits and risks of the procedure, side effects of the proposed treatment, the likelihood of the patient achieving the goals of the procedure, and any potential problems that might occur during the procedure or recuperation. Informed consent has been obtained.  Description of procedure:  The patient was taken to the operating room and general anesthesia was induced.  The patient was placed in the dorsal lithotomy position, prepped and draped in the usual sterile fashion, and preoperative antibiotics were administered. A preoperative time-out was  performed.   A 21 French 30 cystoscope was then gently passed to the patient's urethra and into the bladder. I then grasped the stent emanating from the patient's right ureteral orifice and pulled to the patient's urethral meatus. I then passed a 0.038 sensor wire up through the stent and into the right renal pelvis removing the stent over the wire. I then exchanged the wire for a 5 French open ureteral catheter and performed retrograde pyelogram with the above findings. I then reintroduced the safety wire removing the open-ended catheter over the wire. I then advanced a 4/6 French semirigid ureteroscope through the patient's urethra and into the bladder under visual guidance. I was able to cannulate the right ureteral orifice and navigate the ureter up to the obstructing stone. Here I used a 0 tip basket to remove the stone and bring it more distally. During the transport, the stone broke up into pieces thus, no laser fragmentation was required. The remaining stone fragments were removed with the N-compass basket. I then advanced the second wire through the ureteroscope and into the right renal pelvis removing the scope over the wire. I then passed the flexible ureteroscope over the wire and into the right renal pelvis under fluoroscopic guidance removing the wire. Recognizing a fairly large tumor not pass a 12/14 [place] ureteral access sheath. This was performed by advancing a second wire through the flexible ureteroscope removing the scope over the wire. I then advanced a 12/14 French ureteral access sheath under fluoroscopic guidance to the proximal ureter removing the inner portion as well as a safety wire. Pyeloscopy was then performed noting a large stone burden in the lower pole with significant amount of  sediment which appeared to be a poorly draining lower pole moiety. At this point, I opted to remove the scope and pass a stent, as it was decided that the patient would be better served with a  PCNL.  The ureteroscope was then backed out under visual guidance during that there was no significant ureteral trauma. I then backloaded the cystoscope over the safety wire and passed the cystoscope into the patient's bladder under visual guidance. I then advanced a 28 cm times 5 French pole areas stent over the wire and into the right renal pelvis. Once the stent was noted to be within the pelvis the wire was backed out slowly as the stent was advanced to the patient's bladder neck prior to removing the wire completely. All the stone fragments that were passed into the bladder were removed. A B and O suppository placed in the patient's rectum and urethral lidocaine jelly was placed in the patient's urethra.  Ardis Hughs, M.D.

## 2016-04-10 NOTE — Progress Notes (Signed)
Bladder scan shows 78 ml.

## 2016-04-11 DIAGNOSIS — N202 Calculus of kidney with calculus of ureter: Secondary | ICD-10-CM | POA: Diagnosis not present

## 2016-04-11 DIAGNOSIS — I10 Essential (primary) hypertension: Secondary | ICD-10-CM | POA: Diagnosis not present

## 2016-04-11 DIAGNOSIS — F329 Major depressive disorder, single episode, unspecified: Secondary | ICD-10-CM | POA: Diagnosis not present

## 2016-04-11 DIAGNOSIS — K5900 Constipation, unspecified: Secondary | ICD-10-CM | POA: Diagnosis not present

## 2016-04-11 DIAGNOSIS — K219 Gastro-esophageal reflux disease without esophagitis: Secondary | ICD-10-CM | POA: Diagnosis not present

## 2016-04-11 DIAGNOSIS — M1990 Unspecified osteoarthritis, unspecified site: Secondary | ICD-10-CM | POA: Diagnosis not present

## 2016-04-11 MED ORDER — OXYBUTYNIN CHLORIDE 5 MG PO TABS: 5.0000 mg | ORAL_TABLET | Freq: Three times a day (TID) | ORAL | 1 refills | Status: DC | PRN

## 2016-04-11 MED ORDER — OXYCODONE-ACETAMINOPHEN 5-325 MG PO TABS: 1.0000 | ORAL_TABLET | ORAL | 0 refills | Status: DC | PRN

## 2016-04-11 NOTE — Progress Notes (Signed)
Interpreter to pt room with RN to assist in discharge instructions. Pt educated via teach back method on catheter care including how to switch from leg bag to big bag, and proper cleaning procedure. Pt verbalizes understanding and demonstrates appropriately. Questions answered by RN. IV taken out and pt dressed in clothes. Pt awaiting taxi voucher.

## 2016-04-11 NOTE — Discharge Instructions (Signed)
DISCHARGE INSTRUCTIONS FOR KIDNEY STONE/URETERAL STENT   MEDICATIONS:  1.  Resume all your other meds from home - except do not take any extra narcotic pain meds that you may have at home.  2. Pyridium is to help with the burning/stinging when you urinate. 3. Tramadol is for moderate/severe pain, otherwise taking upto 1000 mg every 6 hours of plainTylenol will help treat your pain.   4. Take Cipro one hour prior to removal of your stent.   ACTIVITY:  1. No strenuous activity x 1week  2. No driving while on narcotic pain medications  3. Drink plenty of water  4. Continue to walk at home - you can still get blood clots when you are at home, so keep active, but don't over do it.  5. May return to work/school tomorrow or when you feel ready   BATHING:  1. You can shower and we recommend daily showers  2. You have a string coming from your urethra: The stent string is attached to your ureteral stent. Do not pull on this.   SIGNS/SYMPTOMS TO CALL:  Please call us if you have a fever greater than 101.5, uncontrolled nausea/vomiting, uncontrolled pain, dizziness, unable to urinate, bloody urine, chest pain, shortness of breath, leg swelling, leg pain, redness around wound, drainage from wound, or any other concerns or questions.   You can reach [place] at [phone].   FOLLOW-UP:  1. We will contact you with a follow-up appointment.  The short take a stool softener at least once a day.  The pain medicine in the medicine for bladder spasms.  Will make you constipated.  Is important that you have normal bowel movements.

## 2016-04-11 NOTE — Discharge Summary (Signed)
Patient ID: [name] MRN: [id number] DOB/AGE: 07/24/1960 55 y.o.  Admit date: 04/10/2016 Discharge date: 04/11/2016  Primary Care Physician:  Arnoldo Morale, MD  Discharge Diagnoses: Urolithiasis, urinary retention  Consults:  None   Discharge Medications   Significant Diagnostic Studies:  No results found.  Brief H and P: For complete details please refer to admission H and P, but in brief the patient was admitted for management of a right ureteral stone.  He was left with the stent.  Because of social issues/patient deafness, he was left overnight for observation.  He could not void well.  Afterwards, and a Foley catheter was placed.  He was discharged home on postoperative day with Foley catheter.  Hospital Course:  Active Problems:   Nephrolithiasis   Day of Discharge BP 135/89 (BP Location: Left Arm)   Pulse 62   Temp 98.6 F (37 C) (Oral)   Resp 16   Ht 5' 11.5" (1.816 m)   Wt 77.6 kg (171 lb)   SpO2 100%   BMI 23.52 kg/m   No results found for this or any previous visit (from the past 24 hour(s)).  Physical Exam: General: Alert and awake oriented x3 not in any acute distress. HEENT: anicteric sclera, pupils reactive to light and accommodation CVS: S1-S2 clear no murmur rubs or gallops Chest: clear to auscultation bilaterally, no wheezing rales or rhonchi Abdomen: soft nontender, nondistended, normal bowel sounds, no organomegaly Extremities: no cyanosis, clubbing or edema noted bilaterally Neuro: Cranial nerves II-XII intact, no focal neurological deficits  Disposition:  Home  Diet:  No restrictions  Activity:  No restrictions   Disposition and Follow-up: Discharge Instructions    Discharge patient    Complete by:  As directed        We will call patient for voiding trial  TESTS THAT NEED FOLLOW-UP    DISCHARGE FOLLOW-UP Follow-up Information    Ardis Hughs, MD Follow up in 2 week(s).   Specialty:  Urology Why:  We will  call you to set up follow-up to remove your catheter. Contact information: [address] [place] [address] [phone]           Time spent on Discharge:  15 minutes  Signed: Jorja Loa 04/11/2016, 9:47 AM

## 2016-04-11 NOTE — Progress Notes (Signed)
Patient refused food. Nurse notified

## 2016-04-11 NOTE — Progress Notes (Signed)
Pt c/o inability to void. Bladder scan done - 386 mL. MD on call paged and foley catheter ordered verbally. Catheter inserted by this RN with assistance. 400 mL of urine in drainage bag upon insertion. Catheter secured using leg strap.

## 2016-04-14 DIAGNOSIS — N200 Calculus of kidney: Secondary | ICD-10-CM | POA: Diagnosis not present

## 2016-04-14 DIAGNOSIS — R338 Other retention of urine: Secondary | ICD-10-CM | POA: Diagnosis not present

## 2016-04-22 ENCOUNTER — Other Ambulatory Visit: Payer: Self-pay | Admitting: Urology

## 2016-05-07 ENCOUNTER — Other Ambulatory Visit (HOSPITAL_COMMUNITY): Payer: Medicare Other

## 2016-05-13 ENCOUNTER — Encounter (HOSPITAL_COMMUNITY)
Admission: RE | Admit: 2016-05-13 | Discharge: 2016-05-13 | Disposition: A | Discharge status: Home / Self Care | Payer: Medicare Other | Source: Ambulatory Visit | Attending: Urology | Admitting: Urology

## 2016-05-13 ENCOUNTER — Encounter (HOSPITAL_COMMUNITY): Payer: Self-pay | Admitting: *Deleted

## 2016-05-13 NOTE — Patient Instructions (Signed)
DAMARIAN AREY  05/13/2016   Your procedure is scheduled on: Friday 05/15/2016  Report to [facility] Main  Entrance take [place]  elevators to 3rd floor to  [place] at    [place] AM.  Call this number if you have problems the morning of surgery [phone]   Remember: ONLY 1 PERSON MAY GO WITH YOU TO SHORT STAY TO GET  READY MORNING OF [place].   Do not eat food or drink liquids :After Midnight.     Take these medicines the morning of surgery with A SIP OF WATER: use clear eyes if needed                                 You may not have any metal on your body including hair pins and              piercings  Do not wear jewelry, make-up, lotions, powders or perfumes, deodorant             Do not wear nail polish.  Do not shave  48 hours prior to surgery.              Men may shave face and neck.   Do not bring valuables to the hospital. [place].  Contacts, dentures or bridgework may not be worn into surgery.  Leave suitcase in the car. After surgery it may be brought to your room.                  Please read over the following fact sheets you were given: _____________________________________________________________________             [facility] - Preparing for Surgery Before surgery, you can play an important role.  Because skin is not sterile, your skin needs to be as free of germs as possible.  You can reduce the number of germs on your skin by washing with CHG (chlorahexidine gluconate) soap before surgery.  CHG is an antiseptic cleaner which kills germs and bonds with the skin to continue killing germs even after washing. Please DO NOT use if you have an allergy to CHG or antibacterial soaps.  If your skin becomes reddened/irritated stop using the CHG and inform your nurse when you arrive at Short Stay. Do not shave (including legs and underarms) for at least 48 hours prior to the first  CHG shower.  You may shave your face/neck. Please follow these instructions carefully:  1.  Shower with CHG Soap the night before surgery and the  morning of Surgery.  2.  If you choose to wash your hair, wash your hair first as usual with your  normal  shampoo.  3.  After you shampoo, rinse your hair and body thoroughly to remove the  shampoo.                           4.  Use CHG as you would any other liquid soap.  You can apply chg directly  to the skin and wash                       Gently with a scrungie  or clean washcloth.  5.  Apply the CHG Soap to your body ONLY FROM THE NECK DOWN.   Do not use on face/ open                           Wound or open sores. Avoid contact with eyes, ears mouth and genitals (private parts).                       Wash face,  Genitals (private parts) with your normal soap.             6.  Wash thoroughly, paying special attention to the area where your surgery  will be performed.  7.  Thoroughly rinse your body with warm water from the neck down.  8.  DO NOT shower/wash with your normal soap after using and rinsing off  the CHG Soap.                9.  Pat yourself dry with a clean towel.            10.  Wear clean pajamas.            11.  Place clean sheets on your bed the night of your first shower and do not  sleep with pets. Day of Surgery : Do not apply any lotions/deodorants the morning of surgery.  Please wear clean clothes to the hospital/surgery center.  FAILURE TO FOLLOW THESE INSTRUCTIONS MAY RESULT IN THE CANCELLATION OF YOUR SURGERY PATIENT SIGNATURE_________________________________  NURSE SIGNATURE__________________________________  ________________________________________________________________________

## 2016-05-15 ENCOUNTER — Inpatient Hospital Stay (HOSPITAL_COMMUNITY): Admission: RE | Admit: 2016-05-15 | Payer: Medicare Other | Source: Ambulatory Visit | Admitting: Urology

## 2016-05-15 ENCOUNTER — Other Ambulatory Visit: Payer: Self-pay | Admitting: Urology

## 2016-05-15 SURGERY — NEPHROLITHOTOMY PERCUTANEOUS
Anesthesia: General | Laterality: Right

## 2016-05-15 MED ORDER — URIBEL 118 MG PO CAPS: 1.0000 | ORAL_CAPSULE | Freq: Four times a day (QID) | ORAL | 0 refills | Status: AC

## 2016-05-25 ENCOUNTER — Encounter (HOSPITAL_COMMUNITY)
Admission: RE | Disposition: A | Discharge status: Home / Self Care | Payer: Self-pay | Source: Ambulatory Visit | Attending: Urology

## 2016-05-25 ENCOUNTER — Observation Stay (HOSPITAL_COMMUNITY)
Admission: RE | Admit: 2016-05-25 | Discharge: 2016-05-27 | Disposition: A | Discharge status: Home / Self Care | Payer: Medicare Other | Source: Ambulatory Visit | Attending: Urology | Admitting: Urology

## 2016-05-25 ENCOUNTER — Ambulatory Visit (HOSPITAL_COMMUNITY): Payer: Medicare Other | Admitting: Anesthesiology

## 2016-05-25 ENCOUNTER — Encounter (HOSPITAL_COMMUNITY): Payer: Self-pay | Admitting: Anesthesiology

## 2016-05-25 ENCOUNTER — Ambulatory Visit (HOSPITAL_COMMUNITY): Payer: Medicare Other

## 2016-05-25 DIAGNOSIS — M19012 Primary osteoarthritis, left shoulder: Secondary | ICD-10-CM | POA: Diagnosis not present

## 2016-05-25 DIAGNOSIS — Z888 Allergy status to other drugs, medicaments and biological substances status: Secondary | ICD-10-CM | POA: Insufficient documentation

## 2016-05-25 DIAGNOSIS — H9190 Unspecified hearing loss, unspecified ear: Secondary | ICD-10-CM | POA: Insufficient documentation

## 2016-05-25 DIAGNOSIS — T83192A Other mechanical complication of urinary stent, initial encounter: Secondary | ICD-10-CM | POA: Diagnosis not present

## 2016-05-25 DIAGNOSIS — R935 Abnormal findings on diagnostic imaging of other abdominal regions, including retroperitoneum: Secondary | ICD-10-CM | POA: Diagnosis not present

## 2016-05-25 DIAGNOSIS — Z9049 Acquired absence of other specified parts of digestive tract: Secondary | ICD-10-CM | POA: Diagnosis not present

## 2016-05-25 DIAGNOSIS — I10 Essential (primary) hypertension: Secondary | ICD-10-CM | POA: Diagnosis not present

## 2016-05-25 DIAGNOSIS — K219 Gastro-esophageal reflux disease without esophagitis: Secondary | ICD-10-CM | POA: Insufficient documentation

## 2016-05-25 DIAGNOSIS — N201 Calculus of ureter: Secondary | ICD-10-CM

## 2016-05-25 DIAGNOSIS — N202 Calculus of kidney with calculus of ureter: Principal | ICD-10-CM | POA: Insufficient documentation

## 2016-05-25 DIAGNOSIS — N200 Calculus of kidney: Secondary | ICD-10-CM

## 2016-05-25 DIAGNOSIS — Z885 Allergy status to narcotic agent status: Secondary | ICD-10-CM | POA: Diagnosis not present

## 2016-05-25 DIAGNOSIS — M5412 Radiculopathy, cervical region: Secondary | ICD-10-CM | POA: Insufficient documentation

## 2016-05-25 DIAGNOSIS — K5900 Constipation, unspecified: Secondary | ICD-10-CM | POA: Diagnosis not present

## 2016-05-25 DIAGNOSIS — N529 Male erectile dysfunction, unspecified: Secondary | ICD-10-CM | POA: Insufficient documentation

## 2016-05-25 DIAGNOSIS — M4802 Spinal stenosis, cervical region: Secondary | ICD-10-CM | POA: Insufficient documentation

## 2016-05-25 DIAGNOSIS — F329 Major depressive disorder, single episode, unspecified: Secondary | ICD-10-CM | POA: Insufficient documentation

## 2016-05-25 DIAGNOSIS — N2889 Other specified disorders of kidney and ureter: Secondary | ICD-10-CM | POA: Diagnosis not present

## 2016-05-25 HISTORY — PX: NEPHROLITHOTOMY: SHX5134

## 2016-05-25 LAB — BASIC METABOLIC PANEL
Anion gap: 5 (ref 5–15)
Anion gap: 5 (ref 5–15)
BUN: 32 mg/dL — ABNORMAL HIGH (ref 6–20)
BUN: 29 mg/dL — AB (ref 6–20)
CALCIUM: 9.2 mg/dL (ref 8.9–10.3)
CALCIUM: 9 mg/dL (ref 8.9–10.3)
CHLORIDE: 109 mmol/L (ref 101–111)
CO2: 26 mmol/L (ref 22–32)
CO2: 26 mmol/L (ref 22–32)
CREATININE: 1.09 mg/dL (ref 0.61–1.24)
CREATININE: 1.1 mg/dL (ref 0.61–1.24)
Chloride: 111 mmol/L (ref 101–111)
GFR calc non Af Amer: >60 mL/min (ref >60)
GFR calc non Af Amer: >60 mL/min (ref >60)
GLUCOSE: 102 mg/dL — AB (ref 65–99)
Glucose, Bld: 109 mg/dL — ABNORMAL HIGH (ref 65–99)
Potassium: 4.1 mmol/L (ref 3.5–5.1)
Potassium: 4.2 mmol/L (ref 3.5–5.1)
SODIUM: 142 mmol/L (ref 135–145)
Sodium: 140 mmol/L (ref 135–145)

## 2016-05-25 LAB — TYPE AND SCREEN
ABO/RH(D): B POS
ANTIBODY SCREEN: NEGATIVE

## 2016-05-25 LAB — HEMOGLOBIN AND HEMATOCRIT, BLOOD
HCT: 35.8 % — ABNORMAL LOW (ref 39.0–52.0)
Hemoglobin: 11.9 g/dL — ABNORMAL LOW (ref 13.0–17.0)

## 2016-05-25 SURGERY — NEPHROLITHOTOMY PERCUTANEOUS
Anesthesia: General | Laterality: Right

## 2016-05-25 MED ORDER — OXYCODONE HCL 5 MG PO TABS: 5.0000 mg | ORAL_TABLET | Freq: Once | ORAL | Status: DC | PRN

## 2016-05-25 MED ORDER — PROPOFOL 10 MG/ML IV BOLUS
INTRAVENOUS | Status: AC
  Filled 2016-05-25: qty 20

## 2016-05-25 MED ORDER — LIDOCAINE 2% (20 MG/ML) 5 ML SYRINGE
INTRAMUSCULAR | Status: AC
  Filled 2016-05-25: qty 5

## 2016-05-25 MED ORDER — BUPIVACAINE HCL (PF) 0.25 % IJ SOLN
INTRAMUSCULAR | Status: AC
  Filled 2016-05-25: qty 30

## 2016-05-25 MED ORDER — MIDAZOLAM HCL 5 MG/5ML IJ SOLN
INTRAMUSCULAR | Status: DC | PRN
  Administered 2016-05-25: 2 mg via INTRAVENOUS

## 2016-05-25 MED ORDER — DOCUSATE SODIUM 100 MG PO CAPS
100.0000 mg | ORAL_CAPSULE | Freq: Two times a day (BID) | ORAL | Status: DC
  Administered 2016-05-25 – 2016-05-27 (×4): 100 mg via ORAL
  Filled 2016-05-25 (×4): qty 1

## 2016-05-25 MED ORDER — CEFAZOLIN SODIUM-DEXTROSE 2-4 GM/100ML-% IV SOLN
2.0000 g | INTRAVENOUS | Status: AC
  Administered 2016-05-25: 2 g via INTRAVENOUS

## 2016-05-25 MED ORDER — SUGAMMADEX SODIUM 200 MG/2ML IV SOLN
INTRAVENOUS | Status: DC | PRN
  Administered 2016-05-25: 200 mg via INTRAVENOUS

## 2016-05-25 MED ORDER — LACTATED RINGERS IV SOLN
INTRAVENOUS | Status: DC | PRN
  Administered 2016-05-25: 11:00:00 via INTRAVENOUS

## 2016-05-25 MED ORDER — FENTANYL CITRATE (PF) 100 MCG/2ML IJ SOLN
25.0000 ug | INTRAMUSCULAR | Status: DC | PRN
  Administered 2016-05-25: 50 ug via INTRAVENOUS

## 2016-05-25 MED ORDER — DEXAMETHASONE SODIUM PHOSPHATE 10 MG/ML IJ SOLN
INTRAMUSCULAR | Status: DC | PRN
  Administered 2016-05-25: 10 mg via INTRAVENOUS

## 2016-05-25 MED ORDER — OXYCODONE HCL 5 MG/5ML PO SOLN
5.0000 mg | Freq: Once | ORAL | Status: DC | PRN
  Filled 2016-05-25: qty 5

## 2016-05-25 MED ORDER — SUGAMMADEX SODIUM 500 MG/5ML IV SOLN
INTRAVENOUS | Status: AC
  Filled 2016-05-25: qty 5

## 2016-05-25 MED ORDER — ONDANSETRON HCL 4 MG/2ML IJ SOLN
INTRAMUSCULAR | Status: AC
  Filled 2016-05-25: qty 2

## 2016-05-25 MED ORDER — NAPHAZOLINE-GLYCERIN 0.012-0.2 % OP SOLN
1.0000 [drp] | Freq: Four times a day (QID) | OPHTHALMIC | Status: DC | PRN
  Filled 2016-05-25: qty 15

## 2016-05-25 MED ORDER — IOHEXOL 300 MG/ML  SOLN
INTRAMUSCULAR | Status: DC | PRN
  Administered 2016-05-25: 55 mL

## 2016-05-25 MED ORDER — FENTANYL CITRATE (PF) 100 MCG/2ML IJ SOLN
INTRAMUSCULAR | Status: DC | PRN
  Administered 2016-05-25 (×3): 50 ug via INTRAVENOUS
  Administered 2016-05-25: 100 ug via INTRAVENOUS

## 2016-05-25 MED ORDER — MIDAZOLAM HCL 2 MG/2ML IJ SOLN
INTRAMUSCULAR | Status: AC
  Filled 2016-05-25: qty 2

## 2016-05-25 MED ORDER — KCL IN DEXTROSE-NACL 20-5-0.45 MEQ/L-%-% IV SOLN
INTRAVENOUS | Status: DC
  Administered 2016-05-25 – 2016-05-26 (×3): via INTRAVENOUS
  Filled 2016-05-25 (×5): qty 1000

## 2016-05-25 MED ORDER — ROCURONIUM BROMIDE 10 MG/ML (PF) SYRINGE
PREFILLED_SYRINGE | INTRAVENOUS | Status: DC | PRN
  Administered 2016-05-25: 5 mg via INTRAVENOUS
  Administered 2016-05-25 (×2): 10 mg via INTRAVENOUS
  Administered 2016-05-25: 40 mg via INTRAVENOUS

## 2016-05-25 MED ORDER — ONDANSETRON HCL 4 MG/2ML IJ SOLN: 4.0000 mg | Freq: Four times a day (QID) | INTRAMUSCULAR | Status: DC | PRN

## 2016-05-25 MED ORDER — SUGAMMADEX SODIUM 200 MG/2ML IV SOLN
INTRAVENOUS | Status: AC
  Filled 2016-05-25: qty 2

## 2016-05-25 MED ORDER — FENTANYL CITRATE (PF) 100 MCG/2ML IJ SOLN
INTRAMUSCULAR | Status: AC
  Administered 2016-05-25: 50 ug via INTRAVENOUS
  Filled 2016-05-25: qty 2

## 2016-05-25 MED ORDER — BUPIVACAINE HCL (PF) 0.25 % IJ SOLN
INTRAMUSCULAR | Status: DC | PRN
  Administered 2016-05-25: 30 mL

## 2016-05-25 MED ORDER — TRAMADOL HCL 50 MG PO TABS
50.0000 mg | ORAL_TABLET | Freq: Four times a day (QID) | ORAL | Status: DC | PRN
Start: 2016-05-25 — End: 2016-05-27
  Administered 2016-05-25: 100 mg via ORAL
  Administered 2016-05-26: 50 mg via ORAL
  Administered 2016-05-27: 100 mg via ORAL
  Filled 2016-05-25: qty 1
  Filled 2016-05-25 (×2): qty 2
  Filled 2016-05-25 (×2): qty 1

## 2016-05-25 MED ORDER — CIPROFLOXACIN IN D5W 400 MG/200ML IV SOLN
INTRAVENOUS | Status: AC
Start: 2016-05-25 — End: 2016-05-25
  Filled 2016-05-25: qty 200

## 2016-05-25 MED ORDER — ACETAMINOPHEN 10 MG/ML IV SOLN
1000.0000 mg | Freq: Once | INTRAVENOUS | Status: AC
  Administered 2016-05-25: 1000 mg via INTRAVENOUS
  Filled 2016-05-25: qty 100

## 2016-05-25 MED ORDER — DEXAMETHASONE SODIUM PHOSPHATE 10 MG/ML IJ SOLN
INTRAMUSCULAR | Status: AC
  Filled 2016-05-25: qty 1

## 2016-05-25 MED ORDER — ONDANSETRON HCL 4 MG/2ML IJ SOLN
4.0000 mg | INTRAMUSCULAR | Status: DC | PRN
  Filled 2016-05-25: qty 2

## 2016-05-25 MED ORDER — CIPROFLOXACIN HCL 500 MG PO TABS
500.0000 mg | ORAL_TABLET | Freq: Two times a day (BID) | ORAL | Status: DC
  Administered 2016-05-25 – 2016-05-27 (×4): 500 mg via ORAL
  Filled 2016-05-25 (×4): qty 1

## 2016-05-25 MED ORDER — CEFAZOLIN SODIUM-DEXTROSE 2-4 GM/100ML-% IV SOLN
INTRAVENOUS | Status: AC
  Filled 2016-05-25: qty 100

## 2016-05-25 MED ORDER — PROPOFOL 10 MG/ML IV BOLUS
INTRAVENOUS | Status: DC | PRN
  Administered 2016-05-25: 200 mg via INTRAVENOUS

## 2016-05-25 MED ORDER — ONDANSETRON HCL 4 MG/2ML IJ SOLN
INTRAMUSCULAR | Status: DC | PRN
  Administered 2016-05-25: 4 mg via INTRAVENOUS

## 2016-05-25 MED ORDER — LIDOCAINE 2% (20 MG/ML) 5 ML SYRINGE
INTRAMUSCULAR | Status: DC | PRN
  Administered 2016-05-25: 80 mg via INTRAVENOUS

## 2016-05-25 MED ORDER — ACETAMINOPHEN 500 MG PO TABS
1000.0000 mg | ORAL_TABLET | Freq: Four times a day (QID) | ORAL | Status: AC
  Administered 2016-05-25 – 2016-05-26 (×4): 1000 mg via ORAL
  Filled 2016-05-25 (×4): qty 2

## 2016-05-25 MED ORDER — SODIUM CHLORIDE 0.9 % IR SOLN
Status: DC | PRN
  Administered 2016-05-25: 6000 mL

## 2016-05-25 MED ORDER — SENNA 8.6 MG PO TABS
1.0000 | ORAL_TABLET | Freq: Two times a day (BID) | ORAL | Status: DC
  Administered 2016-05-25 – 2016-05-27 (×4): 8.6 mg via ORAL
  Filled 2016-05-25 (×4): qty 1

## 2016-05-25 MED ORDER — MORPHINE SULFATE (PF) 2 MG/ML IV SOLN
2.0000 mg | INTRAVENOUS | Status: DC | PRN
  Administered 2016-05-25 – 2016-05-26 (×4): 4 mg via INTRAVENOUS
  Administered 2016-05-26: 2 mg via INTRAVENOUS
  Administered 2016-05-26 (×2): 4 mg via INTRAVENOUS
  Filled 2016-05-25: qty 2
  Filled 2016-05-25: qty 1
  Filled 2016-05-25 (×5): qty 2

## 2016-05-25 MED ORDER — CIPROFLOXACIN IN D5W 400 MG/200ML IV SOLN
400.0000 mg | INTRAVENOUS | Status: AC
  Administered 2016-05-25: 400 mg via INTRAVENOUS

## 2016-05-25 MED ORDER — ROCURONIUM BROMIDE 50 MG/5ML IV SOSY
PREFILLED_SYRINGE | INTRAVENOUS | Status: AC
  Filled 2016-05-25: qty 10

## 2016-05-25 MED ORDER — FENTANYL CITRATE (PF) 250 MCG/5ML IJ SOLN
INTRAMUSCULAR | Status: AC
  Filled 2016-05-25: qty 5

## 2016-05-25 SURGICAL SUPPLY — 62 items
APL SKNCLS STERI-STRIP NONHPOA (GAUZE/BANDAGES/DRESSINGS) ×1
BAG URINE DRAINAGE (UROLOGICAL SUPPLIES) ×4 IMPLANT
BASKET STNLS GEMINI 4WIRE 3FR (BASKET) IMPLANT
BASKET STONE NCOMPASS (UROLOGICAL SUPPLIES) ×2 IMPLANT
BASKET ZERO TIP NITINOL 2.4FR (BASKET) IMPLANT
BENZOIN TINCTURE PRP APPL 2/3 (GAUZE/BANDAGES/DRESSINGS) ×3 IMPLANT
BLADE SURG 15 STRL LF DISP TIS (BLADE) ×1 IMPLANT
BLADE SURG 15 STRL SS (BLADE) ×3
BSKT STON RTRVL GEM 120X11 3FR (BASKET)
BSKT STON RTRVL ZERO TP 2.4FR (BASKET)
CATCHER STONE W/TUBE ADAPTER (UROLOGICAL SUPPLIES) IMPLANT
CATH AINSWORTH 30CC 24FR (CATHETERS) IMPLANT
CATH FOLEY 2WAY SLVR  5CC 16FR (CATHETERS) ×2
CATH FOLEY 2WAY SLVR 5CC 16FR (CATHETERS) ×1 IMPLANT
CATH IMAGER II 65CM (CATHETERS) ×3 IMPLANT
CATH ULTRATHANE 14FR (CATHETERS) ×2 IMPLANT
CATH URET 5FR 28IN OPEN ENDED (CATHETERS) ×3 IMPLANT
CATH X-FORCE N30 NEPHROSTOMY (TUBING) ×3 IMPLANT
CHLORAPREP W/TINT 26ML (MISCELLANEOUS) ×3 IMPLANT
CLOSURE WOUND 1/2 X4 (GAUZE/BANDAGES/DRESSINGS) ×1
COVER SURGICAL LIGHT HANDLE (MISCELLANEOUS) ×1 IMPLANT
DRAPE C-ARM 42X120 X-RAY (DRAPES) ×3 IMPLANT
DRAPE LINGEMAN PERC (DRAPES) ×3 IMPLANT
DRSG PAD ABDOMINAL 8X10 ST (GAUZE/BANDAGES/DRESSINGS) ×6 IMPLANT
DRSG TEGADERM 8X12 (GAUZE/BANDAGES/DRESSINGS) ×4 IMPLANT
FIBER LASER FLEXIVA 1000 (UROLOGICAL SUPPLIES) IMPLANT
FIBER LASER FLEXIVA 365 (UROLOGICAL SUPPLIES) IMPLANT
FIBER LASER FLEXIVA 550 (UROLOGICAL SUPPLIES) IMPLANT
FIBER LASER TRAC TIP (UROLOGICAL SUPPLIES) IMPLANT
GAUZE SPONGE 4X4 12PLY STRL (GAUZE/BANDAGES/DRESSINGS) ×3 IMPLANT
GLOVE BIOGEL M STRL SZ7.5 (GLOVE) ×3 IMPLANT
GOWN STRL REUS W/TWL XL LVL3 (GOWN DISPOSABLE) ×3 IMPLANT
GUIDEWIRE AMPLAZ .035X145 (WIRE) ×6 IMPLANT
GUIDEWIRE ANG ZIPWIRE 038X150 (WIRE) IMPLANT
GUIDEWIRE STR DUAL SENSOR (WIRE) ×3 IMPLANT
INSERT SILICONE FOR G14464 (MISCELLANEOUS) ×3 IMPLANT
IV SET EXTENSION CATH 6 NF (IV SETS) ×3 IMPLANT
KIT BASIN OR (CUSTOM PROCEDURE TRAY) ×3 IMPLANT
MANIFOLD NEPTUNE II (INSTRUMENTS) ×3 IMPLANT
NDL SPNL 20GX3.5 QUINCKE YW (NEEDLE) IMPLANT
NDL TROCAR 18X15 ECHO (NEEDLE) IMPLANT
NDL TROCAR 18X20 (NEEDLE) IMPLANT
NEEDLE SPNL 20GX3.5 QUINCKE YW (NEEDLE) ×3 IMPLANT
NEEDLE TROCAR 18X15 ECHO (NEEDLE) ×3 IMPLANT
NEEDLE TROCAR 18X20 (NEEDLE) IMPLANT
NS IRRIG 1000ML POUR BTL (IV SOLUTION) ×3 IMPLANT
PACK CYSTO (CUSTOM PROCEDURE TRAY) ×3 IMPLANT
PAD ABD 8X10 STRL (GAUZE/BANDAGES/DRESSINGS) ×3 IMPLANT
PROBE LITHOCLAST ULTRA 3.8X403 (UROLOGICAL SUPPLIES) ×2 IMPLANT
PROBE PNEUMATIC 1.0MMX570MM (UROLOGICAL SUPPLIES) ×2 IMPLANT
SHEATH PEELAWAY SET 9 (SHEATH) ×3 IMPLANT
SPONGE LAP 4X18 X RAY DECT (DISPOSABLE) ×3 IMPLANT
STONE CATCHER W/TUBE ADAPTER (UROLOGICAL SUPPLIES) ×3 IMPLANT
STRIP CLOSURE SKIN 1/2X4 (GAUZE/BANDAGES/DRESSINGS) ×1 IMPLANT
SUT ETHILON 2 0 PS N (SUTURE) ×3 IMPLANT
SYR 10ML LL (SYRINGE) ×3 IMPLANT
SYR 20CC LL (SYRINGE) ×4 IMPLANT
SYR 50ML LL SCALE MARK (SYRINGE) ×3 IMPLANT
TOWEL OR 17X26 10 PK STRL BLUE (TOWEL DISPOSABLE) ×3 IMPLANT
TUBE CONNECTING VINYL 14FR 30C (MISCELLANEOUS) ×2 IMPLANT
TUBING CONNECTING 10 (TUBING) ×3 IMPLANT
TUBING CONNECTING 10' (TUBING) ×1

## 2016-05-25 NOTE — Transfer of Care (Signed)
Immediate Anesthesia Transfer of Care Note  Patient: [name]  Procedure(s) Performed: Procedure(s): RIGHT NEPHROLITHOTOMY PERCUTANEOUS WITH SURGENON ACCESS (Right)  Patient Location: PACU  Anesthesia Type:General  Level of Consciousness:  sedated, patient cooperative and responds to stimulation  Airway & Oxygen Therapy:Patient Spontanous Breathing and Patient connected to face mask oxgen  Post-op Assessment:  Report given to PACU RN and Post -op Vital signs reviewed and stable  Post vital signs:  Reviewed and stable  Last Vitals:  Vitals:   05/25/16 1411 05/25/16 1415  BP: 124/74 (!) (P) 141/77  Pulse: (P) 79 (!) 59  Resp: 11 15  Temp: [id number] C     Complications: No apparent anesthesia complications

## 2016-05-25 NOTE — Anesthesia Preprocedure Evaluation (Signed)
Anesthesia Evaluation  Patient identified by MRN, date of birth, ID band Patient awake    Reviewed: Allergy & Precautions, H&P , NPO status , Patient's Chart, lab work & pertinent test results  Airway Mallampati: II   Neck ROM: full    Dental   Pulmonary Current Smoker,    breath sounds clear to auscultation       Cardiovascular hypertension,  Rhythm:regular Rate:Normal     Neuro/Psych PSYCHIATRIC DISORDERS Depression  Neuromuscular disease    GI/Hepatic GERD  ,  Endo/Other    Renal/GU      Musculoskeletal  (+) Arthritis ,   Abdominal   Peds  Hematology   Anesthesia Other Findings   Reproductive/Obstetrics                             Anesthesia Physical Anesthesia Plan  ASA: II  Anesthesia Plan: General   Post-op Pain Management:    Induction: Intravenous  Airway Management Planned: Oral ETT  Additional Equipment:   Intra-op Plan:   Post-operative Plan: Extubation in OR  Informed Consent: I have reviewed the patients History and Physical, chart, labs and discussed the procedure including the risks, benefits and alternatives for the proposed anesthesia with the patient or authorized representative who has indicated his/her understanding and acceptance.     Plan Discussed with: CRNA, Anesthesiologist and Surgeon  Anesthesia Plan Comments:         Anesthesia Quick Evaluation

## 2016-05-25 NOTE — Anesthesia Postprocedure Evaluation (Signed)
Anesthesia Post Note  Patient: [name]  Procedure(s) Performed: Procedure(s) (LRB): RIGHT NEPHROLITHOTOMY PERCUTANEOUS WITH SURGENON ACCESS (Right)  Patient location during evaluation: PACU Anesthesia Type: General Level of consciousness: awake and alert and patient cooperative Pain management: pain level controlled Vital Signs Assessment: post-procedure vital signs reviewed and stable Respiratory status: spontaneous breathing and respiratory function stable Cardiovascular status: stable Anesthetic complications: no       Last Vitals:  Vitals:   05/25/16 1445 05/25/16 1500  BP: 122/71 (!) 131/93  Pulse: (!) 59 (!) 58  Resp: 12 14  Temp: 36.7 C 36.7 C    Last Pain:  Vitals:   05/25/16 1533  TempSrc:   PainSc: [place]

## 2016-05-25 NOTE — Interval H&P Note (Signed)
History and Physical Interval Note: After treating the patient's right ureteral stone was notable that the patient had a large lower pole calyx full stones with a narrow infundibular neck. As such, we opted to proceed with PCNL today. The patient has been made aware of the risks and the benefits of this operation.  05/25/2016 11:40 AM  [name]  has presented today for surgery, with the diagnosis of RIGHT PARTIAL STAGEHORN CARLCULI  The various methods of treatment have been discussed with the patient and family. After consideration of risks, benefits and other options for treatment, the patient has consented to  Procedure(s): RIGHT NEPHROLITHOTOMY PERCUTANEOUS WITH SURGENON ACCESS (Right) as a surgical intervention .  The patient's history has been reviewed, patient examined, no change in status, stable for surgery.  I have reviewed the patient's chart and labs.  Questions were answered to the patient's satisfaction.     [name]

## 2016-05-25 NOTE — H&P (Signed)
f/u for obstructing stone  HPI: [name] is a 56 year-old male established patient who is here for further eval and management of an obstructing stone.  The patient's stone is on his right side. The stone was 42mm right distal ureter. There are additional stones within the urinary tract. They are located Right lower pole.   The patient has not passed their stone since his visit.   The patient is status post right ureteral stent placement on 03/10/16. He has tolerated the stent poorly. He is taking Flomax but complains of flank pain as well as bladder pain upon urination. He is taking tramadol for his pain as well. He also complains of severe constipation resulting in hemorrhoids. He denies any fevers or chills. He has had some hematuria.     CC: Renal Mass Surveillance  HPI: The mass is on the right side.   The lesion(s) was first noted on 09/22/2015. The mass was seen on CT Scan.   The mass was last imaged 2 weeks. The patient recently underwent a CT scan to re-evaluate the lesion in question. .   He has had previous abdominal surgery. His past surgical history includes: Cholecystectomy. The patient can walk a flight of steps.   The patient denies history of diabetes, heart attack or stroke. There is not a a family history of kidney cancer. There is no family history of brain tumors (AMLs), seizures or brain aneurysm's.   The patient is deaf. He is a smoker.     ALLERGIES: Hydrocodone Vicodin TABS    MEDICATIONS: Ultram 50 mg tablet 1-2 tablet PO Q 6 H  BuPROPion HCl ER (SR) 150 MG Oral Tablet Extended Release 12 Hour Oral  Zantac 150 MG Oral Tablet Oral     GU PSH: Cysto Uretero Remove Stone - 2012 Cystoscopy Insert Stent, Right - 03/08/2016, 2012      PSH Notes: Cystoscopy With Ureteroscopy With Removal Of Calculus, Cystoscopy With Insertion Of Ureteral Stent Right, Hand Surgery, Cholecystectomy, Knee Surgery  rectal bleeding     NON-GU PSH:  Cholecystectomy - 2010 Hand/finger Surgery    GU PMH: Kidney Stone, Right, 76mm in the right renal pelvis. - 01/02/2016 Kidney, Right, Neoplasm of uncertain behavior, Right, 4cm endophytic in the right lower pole. - 01/02/2016 Urinary Frequency - 01/02/2016 BPH w/LUTS, Benign prostatic hyperplasia with urinary obstruction - 2014 ED, arterial insufficiency, Erectile dysfunction due to arterial insufficiency - 2014 Personal Hx urinary calculi, Nephrolithiasis - 2014      PMH Notes:  2009-01-28 13:35:43 - Note: Congenital Deafness   NON-GU PMH: Personal history of other diseases of the digestive system, History of esophageal reflux - 2014 Depression Encounter for general adult medical examination without abnormal findings, Encounter for preventive health examination Hypertension    FAMILY HISTORY: Cancer - Mother Family Health Status Number - Runs In Family liver cancer - Mother rectal cancer - Mother   SOCIAL HISTORY: Marital Status: Single Drinks 1 caffeinated drink per day.     Notes: Caffeine Use, Marital History - Divorced, Occupation:, Tobacco Use, Alcohol Use   REVIEW OF SYSTEMS:    GU Review Male:   Patient denies frequent urination, have to strain to urinate , leakage of urine, hard to postpone urination, erection problems, trouble starting your stream, burning/ pain with urination, stream starts and stops, penile pain, and get up at night to urinate.  Gastrointestinal (Upper):   Patient denies nausea, vomiting, and indigestion/ heartburn.  Gastrointestinal (Lower):   Patient denies diarrhea and constipation.  Constitutional:   Patient denies fever, night sweats, weight loss, and fatigue.  Skin:   Patient denies skin rash/ lesion and itching.  Eyes:   Patient denies blurred vision and double vision.  Ears/ Nose/ Throat:   Patient denies sore throat and sinus problems.  Hematologic/Lymphatic:   Patient denies swollen glands and easy bruising.  Cardiovascular:   Patient  denies leg swelling and chest pains.  Respiratory:   Patient denies cough and shortness of breath.  Endocrine:   Patient denies excessive thirst.  Musculoskeletal:   Patient denies back pain and joint pain.  Neurological:   Patient denies headaches and dizziness.  Psychologic:   Patient denies depression and anxiety.   VITAL SIGNS:      03/24/2016 04:09 PM  Weight 178 lb / 80.74 kg  Height 72 in / 182.88 cm  BP 134/90 mmHg  Pulse 75 /min  BMI 24.1 kg/m   MULTI-SYSTEM PHYSICAL EXAMINATION:    Constitutional: Well-nourished. No physical deformities. Normally developed. Good grooming.  Neck: Neck symmetrical, not swollen. Normal tracheal position.  Respiratory: No labored breathing, no use of accessory muscles. Clear to auscultation  Cardiovascular: Normal temperature, normal extremity pulses, no swelling, no varicosities. Regular rate and rhythm  Lymphatic: No enlargement of neck, axillae, groin.  Skin: No paleness, no jaundice, no cyanosis. No lesion, no ulcer, no rash.  Neurologic / Psychiatric: Oriented to time, oriented to place, oriented to person. No depression, no anxiety, no agitation.  Gastrointestinal: No mass, no tenderness, no rigidity, non obese abdomen.  Eyes: Normal conjunctivae. Normal eyelids.  Ears, Nose, Mouth, and Throat: Left ear no scars, no lesions, no masses. Right ear no scars, no lesions, no masses. Nose no scars, no lesions, no masses. Normal hearing. Normal lips.  Musculoskeletal: Normal gait and station of head and neck.     PAST DATA REVIEWED:  Source Of History:  Patient  Records Review:   Previous Patient Records  X-Ray Review: C.T. Abdomen/Pelvis: Reviewed Films. Discussed With Patient. The patient has a right proximal ureteral stone that has been stented. He also has a complex lesion in the right lower pole that has been incompletely characterized on the imaging but has changed over the past 6 weeks between his imaging modalities.    01/28/09   PSA  Total PSA 0.86     PROCEDURES:          Urinalysis w/Scope - 81001 Dipstick Dipstick Cont'd Micro  Color: Yellow Bilirubin: Neg WBC/hpf: 10 - 20/hpf  Appearance: Cloudy Ketones: Neg RBC/hpf: 3 - 10/hpf  Specific Gravity: 1.025 Blood: 3+ Bacteria: Rare (0-9/hpf)  pH: <=5.0 Protein: 2+ Cystals: NS (Not Seen)  Glucose: Neg Urobilinogen: 0.2 Casts: Hyaline    Nitrites: Neg Trichomonas: Not Present    Leukocyte Esterase: 1+ Mucous: Present      Epithelial Cells: 0 - 5/hpf      Yeast: NS (Not Seen)      Sperm: Not Present    Notes:      ASSESSMENT:      ICD-10 Details  1 GU:   Calculus Ureter - N20.1   2   Benign Neo Kidney, Unspec - D30.00    PLAN:            Medications Refill Meds: Tamsulosin Hcl 0.4 mg capsule, ext release 24 hr 2 capsule PO Daily   #30  0 Refill(s)  Ultram 50 mg tablet 1-2 tablet PO Q 6 H   #45  0 Refill(s)    New Meds:  Colace 100 mg capsule 1 capsule PO BID   #60  5 Refill(s)  Ibuprofen 600 mg tablet 1 tablet PO Q 6 H PRN   #90  0 Refill(s)  Miralax 17 gram/dose powder 1 cap PO Daily 17gms (1 cap full) in 8 oz of water.  #238  1 Refill(s)            Document Letter(s):  Created for Patient: Clinical Summary    I went over the treatment options for their stone. We discussed ongoing medical expulsion therapy, ESWL and ureteroscopy. Ultimately the patient and I agreed that ureterscopy is the best option. I went over this surgery with the patient in detail. The patient understands after being put to sleep, we would proceed with a telescope to access the stone and potentially use a laser to fragment the stone before removing it with a basket. After removing the stone the patient will require temporary stent placement in the ureter. This is an outpatient procedure. I also discussed the potential of not being able to gain access safely into the ureter/kidney. This would require that a stent be placed and then the patient rescheduled several  weeks later for a second attempt. They also understand the small risks of ureteral trauma causing a stricture or permanent damage. I also explained the risk of urinary tract infection. Having gone over the procedure itself, the expected outcome, and the risks/benefits the patient has agreed to proceed.        Notes:   The patient has a right ureteral stone measuring 10-11 mm. He has had a stent now for 2 weeks. He has not had any ongoing fevers or chills but does have some stent discomfort. In addition, the patient has an interesting lesion in the lower pole Of his right kidney which has involved over the course of the last 6 weeks leading me to believe that this is less likely a carcinoma and may be an infectious process.   Our plan is to treat the patient's ureteral stone and then obtain better imaging of his kidney in the form of an MRI if possible to evaluate the renal lesion.

## 2016-05-25 NOTE — Op Note (Signed)
Pre-operative diagnosis: right sided nephrolithiasis, >2.0 centimeters  Post-operative diagnosis: as above   Procedure performed:  cystoscopy, right retrograde pyelogram with interpretation, right percutaneous renal access, right nephrolithotomy, right nephrostogram,  right ureteral stent removal  right nephrostomy tube placement.  Surgeon: Dr. Ardis Hughs  Assistant: none  Anesthesia: General   Complications: None  Specimens: sent to pathology for further analysis  Findings: 1. The retrograde pyelogram demonstrated normal caliber ureter with no filling defects. The lower pole calyx was poorly opacified due to the narrowed calyx neck as well as the stone burden within it. 2. The lower pole was targeted and access obtained fairly quickly within the operation. 3. A 14 French pigtail nephrostomy tube was left at the end of the procedure.  EBL: Approximately 100 cc  Specimens: stone from collecting system - taken to [facility] lab  Indication: Indication: [name] is a 56 y.o.  patient with  a large burden right nephrolithiasis. The patient presented today for follow-up definitive management. After  reviewing the management options for treatment, elected to proceed with the above surgical procedure(s). We have discussed the potential benefits and risks of the procedure, side effects of the proposed treatment, the likelihood of the patient achieving the goals of the procedure, and any potential problems that might occur during the procedure or recuperation. Informed consent has been obtained.   Description:  Consent was obtained in the preoperative holding area. The patient was marked appropriately and then taken back to the operating room where he was intubated on the gurney. The patient was flipped prone onto the OR table. Large jelly rolls were placed in the anterior axillary line on both sides allowing the patient's chest and abdomen to fall  inbetween. The patient was then prepped and draped in the routine sterile fashion in the right flank. A timeout was then held confirming the proper side and procedure as well as antibiotics were administered.  I then used the flexible cystoscope and passed gently into the patient's urethra under visual guidance. Once into the bladder I grasped the stent emanating from the patient's right ureteral orifice and pull it to the urethral meatus. I then passed a wire through the stent and up into the right collecting system. I then removed the stent over the wire and exchanged for a 5 [place] open-ended ureteral Pollock catheter. The Pollack catheter was then advanced up to the UPJ. The wire was then removed and a retrograde pyelogram was performed with the above findings. I then turned my attention to the patient's right flank and obtaining percutaneous renal access.  Using the C-arm rotated at 25 and the bulls-eye technique with an 18-gauge coaxial needle the lower posterior lateral calyx was targeted. Then rotating the C-arm AP depth of our needle was noted to be within the calyx and the inner part of the coaxial needle was removed. Urine was noted to return. A 0.038 sensor wire was then passed through the sheath of the coaxial needle and into the right renal collecting system. The wire was then passed down the ureter and into the bladder using fluoroscopic guide and the sheath of the needle was removed. An angiographic catheter was then advanced into the bladder and the wire removed. A Super Stiff wire was then passed into the angiographic catheter and the angiographic catheter removed. A 9 French peel-away dilator was then advanced over the Super Stiff wire and passed into the renal pelvis and across the UPJ under fluoroscopic guidance. The inner part  of this dilator was removed and the 0.38 sensor wire was passed alongside the Super Stiff wire through the dilator and into the right ureter and down into the  bladder. The angiographic catheter again was passed over the guidewire and advanced into the bladder, the wire was then removed. A Super Stiff wire was then passed through angiographic catheter and angiographic catheter removed. The outer part of the sheath was then removed, establishing 2 superstiff wires through the targeted calyx and into the bladder.   The 59 French NephroMax balloon was then passed over one of the Super Stiff wires and the tip guided down into the targeted calyx. The balloon was then inflated to approximately 12 atm, and once there was no waist noted under fluoroscopy the access sheath was advanced over the balloon. The balloon was then removed. The wires were then placed back into the sheaths and snapped to the drape.   Using the rigid nephroscope to explore the targeted calyx and kidney.  The stones were removed using the lithoclast device. Then using a flexible cystoscope to navigate the remaining calyces of the kidney multiple smaller stone fragments encountered.  Using the 0 tip basket these fragments were grabbed and removed. Contrast was injected through the cystoscope and the calyces systematically inspected under fluoroscopic guidance to ensure that all stone fragments had been removed. The UPJ and proximal ureter were also cleared.   A 50F pigtail catheter was then passed over one of the Super Stiff wires through the sheath and into the renal pelvis. The sheath was then backed out of the kidney.  The pigtail catheter was deployed. A nephrostogram was then performed confirming the position of our nephrostomy tube and reassuring that there were no longer any filling defects from the patient's symptoms.  25 cc of local anesthesia was then injected into the patient's wound, and the wound was closed with 3-0 nylon in 2 vertical mattress sutures. The incision was then padded using a bundle of 4 x 4's and Hypafix tape. Patient was subsequently rolled over to the supine position and  extubated. The patient was returned to the PACU in excellent condition. At the end of the case all lap and needle and sponges were accounted for. There are no perioperative complications.

## 2016-05-26 DIAGNOSIS — M4802 Spinal stenosis, cervical region: Secondary | ICD-10-CM | POA: Diagnosis not present

## 2016-05-26 DIAGNOSIS — N202 Calculus of kidney with calculus of ureter: Secondary | ICD-10-CM | POA: Diagnosis not present

## 2016-05-26 DIAGNOSIS — N529 Male erectile dysfunction, unspecified: Secondary | ICD-10-CM | POA: Diagnosis not present

## 2016-05-26 DIAGNOSIS — M5412 Radiculopathy, cervical region: Secondary | ICD-10-CM | POA: Diagnosis not present

## 2016-05-26 DIAGNOSIS — I10 Essential (primary) hypertension: Secondary | ICD-10-CM | POA: Diagnosis not present

## 2016-05-26 DIAGNOSIS — M19012 Primary osteoarthritis, left shoulder: Secondary | ICD-10-CM | POA: Diagnosis not present

## 2016-05-26 LAB — URINE CULTURE: CULTURE: NO GROWTH

## 2016-05-26 LAB — BASIC METABOLIC PANEL
ANION GAP: 5 (ref 5–15)
BUN: 22 mg/dL — ABNORMAL HIGH (ref 6–20)
CO2: 28 mmol/L (ref 22–32)
Calcium: 9.3 mg/dL (ref 8.9–10.3)
Chloride: 107 mmol/L (ref 101–111)
Creatinine, Ser: 1.14 mg/dL (ref 0.61–1.24)
GFR calc Af Amer: >60 mL/min (ref >60)
GLUCOSE: 164 mg/dL — AB (ref 65–99)
POTASSIUM: 4.7 mmol/L (ref 3.5–5.1)
Sodium: 140 mmol/L (ref 135–145)

## 2016-05-26 LAB — ABO/RH: ABO/RH(D): B POS

## 2016-05-26 LAB — HEMOGLOBIN AND HEMATOCRIT, BLOOD
HEMATOCRIT: 34.6 % — AB (ref 39.0–52.0)
HEMOGLOBIN: 11.6 g/dL — AB (ref 13.0–17.0)

## 2016-05-26 MED ORDER — CALCIUM CARBONATE ANTACID 500 MG PO CHEW
2.0000 | CHEWABLE_TABLET | Freq: Three times a day (TID) | ORAL | Status: DC
  Administered 2016-05-26 – 2016-05-27 (×3): 400 mg via ORAL
  Filled 2016-05-26 (×3): qty 2

## 2016-05-26 MED ORDER — VITAMINS A & D EX OINT
TOPICAL_OINTMENT | CUTANEOUS | Status: AC
  Administered 2016-05-26
  Filled 2016-05-26: qty 5

## 2016-05-26 MED ORDER — MORPHINE SULFATE (PF) 4 MG/ML IV SOLN
4.0000 mg | INTRAVENOUS | Status: DC | PRN
  Administered 2016-05-26: 8 mg via INTRAVENOUS
  Administered 2016-05-26: 4 mg via INTRAVENOUS
  Administered 2016-05-26: 8 mg via INTRAVENOUS
  Administered 2016-05-27 (×2): 4 mg via INTRAVENOUS
  Filled 2016-05-26 (×2): qty 1
  Filled 2016-05-26 (×2): qty 2
  Filled 2016-05-26: qty 1

## 2016-05-26 MED ORDER — BELLADONNA ALKALOIDS-OPIUM 16.2-60 MG RE SUPP
1.0000 | Freq: Four times a day (QID) | RECTAL | Status: DC | PRN
  Administered 2016-05-26 – 2016-05-27 (×2): 1 via RECTAL
  Filled 2016-05-26 (×3): qty 1

## 2016-05-26 NOTE — Progress Notes (Signed)
Assumed care of patient at this time. Patient is stable with no complaints at this time. Agree with previously documented assessment. Will continue to monitor patient.   Unclamped Nephrostomy tube at this time. While unclamped, it drained about 30cc of red urine. Will continue to monitor output and patient's tolerance to being unclamped. Foley catheter is draining without any issues.   Othella Boyer [facility] 05/26/2016 3:40 PM

## 2016-05-26 NOTE — Progress Notes (Signed)
Clamped Neprostomy tube as ordered. Will monitor for discomfort as ordered. SRP, RN

## 2016-05-26 NOTE — Progress Notes (Signed)
Urology Inpatient Progress Report  RIGHT PARTIAL [facility] CALCULI  Procedure(s): RIGHT NEPHROLITHOTOMY PERCUTANEOUS WITH SURGENON ACCESS  1 Day Post-Op   Intv/Subj: No acute events overnight. Patient is without complaint. Unable to tolerate capping of nephrostomy tube.  Active Problems:   Renal stones  Current Facility-Administered Medications  Medication Dose Route Frequency Provider Last Rate Last Dose  . acetaminophen (TYLENOL) tablet 1,000 mg  1,000 mg Oral Q6H Lolita Rieger, MD   1,000 mg at 05/26/16 0537  . ciprofloxacin (CIPRO) tablet 500 mg  500 mg Oral BID Ardis Hughs, MD   500 mg at 05/25/16 2008  . dextrose 5 % and 0.45 % NaCl with KCl 20 mEq/L infusion   Intravenous Continuous Lolita Rieger, MD 125 mL/hr at 05/26/16 0007    . docusate sodium (COLACE) capsule 100 mg  100 mg Oral BID Lolita Rieger, MD   100 mg at 05/25/16 2134  . morphine 2 MG/ML injection 2-4 mg  2-4 mg Intravenous Q2H PRN Lolita Rieger, MD   4 mg at 05/26/16 0537  . naphazoline-glycerin (CLEAR EYES) ophth solution 1-2 drop  1-2 drop Both Eyes QID PRN Lolita Rieger, MD      . ondansetron [facility]) injection 4 mg  4 mg Intravenous Q4H PRN Lolita Rieger, MD      . senna [facility]) tablet 8.6 mg  1 tablet Oral BID Lolita Rieger, MD   8.6 mg at 05/25/16 2134  . traMADol (ULTRAM) tablet 50-100 mg  50-100 mg Oral Q6H PRN Ardis Hughs, MD   100 mg at 05/25/16 2231     Objective: Vital: Vitals:   05/25/16 1500 05/25/16 2045 05/25/16 2326 05/26/16 0617  BP: (!) 131/93 118/73 118/85 115/69  Pulse: (!) 58 62 (!) 50 90  Resp: 14 15 15 16   Temp: 98 F (36.7 C) 98.6 F (37 C) 98.5 F (36.9 C) 98.5 F (36.9 C)  TempSrc:  Oral Oral Oral  SpO2: 100% 97% 99% 96%  Weight:      Height:       I/Os: I/O last 3 completed shifts: In: 1757.1 [P.O.:480; I.V.:1277.1] Out: 705 [Urine:680; Blood:25]  Physical Exam:  General: Patient is in no apparent distress Lungs: Normal respiratory effort, chest expands  symmetrically. GI: Incisions are c/d/i. The abdomen is soft and nontender without mass. JP drain with serosanguinous drainage Foley: straw colored  Ext: lower extremities symmetric  Lab Results:  Recent Labs  05/25/16 1434 05/26/16 0516  HGB 11.9* 11.6*  HCT 35.8* 34.6*    Recent Labs  05/25/16 0925 05/25/16 1434 05/26/16 0516  NA 142 140 140  K 4.1 4.2 4.7  CL 111 109 107  CO2 26 26 28   GLUCOSE 109* 102* 164*  BUN 32* 29* 22*  CREATININE 1.09 1.10 1.14  CALCIUM 9.2 9.0 9.3   No results for input(s): LABPT, INR in the last 72 hours. No results for input(s): LABURIN in the last 72 hours. Results for orders placed or performed during the hospital encounter of 03/10/16  Urine culture     Status: None   Collection Time: 03/10/16  1:44 PM  Result Value Ref Range Status   Specimen Description URINE, CLEAN CATCH  Final   Special Requests NONE  Final   Culture NO GROWTH Performed at [facility]   Final   Report Status 03/11/2016 FINAL  Final    Studies/Results: Dg Abd 1 View  Result Date: 05/25/2016 CLINICAL DATA:  Right percutaneous nephrolithotomy.  EXAM: ABDOMEN - 1 VIEW; DG C-ARM 61-120 MIN-NO REPORT COMPARISON:  None. FINDINGS: Five intraoperative fluoroscopic images of the right abdomen were obtained. These demonstrate placement of ureteral stent from the bladder. Then, percutaneous access was performed into left intrarenal collecting system. IMPRESSION: Status post right percutaneous nephrolithotomy. Electronically Signed   By: Marijo Conception, M.D.   On: 05/25/2016 14:31   Dg C-arm 61-120 Min-no Report  Result Date: 05/25/2016 Fluoroscopy was utilized by the requesting physician.  No radiographic interpretation.    Assessment: Procedure(s): RIGHT NEPHROLITHOTOMY PERCUTANEOUS WITH SURGENON ACCESS, 1 Day Post-Op  doing well.  Plan: Will again try and cap nephrostomy tube. If he tolerates it we will plan to remove tomorrow AM.  The foley should stay  until the nephrostomy is removed. If he does not tolerate the capping, we can remove his foley catheter and leave his neph tube to gravity. He would then need visiting nurses to help him manage his neph tube at home. Plan for discharge tomorrow.   Louis Meckel, MD Urology 05/26/2016, 8:06 AM

## 2016-05-27 DIAGNOSIS — I10 Essential (primary) hypertension: Secondary | ICD-10-CM | POA: Diagnosis not present

## 2016-05-27 DIAGNOSIS — M4802 Spinal stenosis, cervical region: Secondary | ICD-10-CM | POA: Diagnosis not present

## 2016-05-27 DIAGNOSIS — N529 Male erectile dysfunction, unspecified: Secondary | ICD-10-CM | POA: Diagnosis not present

## 2016-05-27 DIAGNOSIS — M5412 Radiculopathy, cervical region: Secondary | ICD-10-CM | POA: Diagnosis not present

## 2016-05-27 DIAGNOSIS — N202 Calculus of kidney with calculus of ureter: Secondary | ICD-10-CM | POA: Diagnosis not present

## 2016-05-27 DIAGNOSIS — M19012 Primary osteoarthritis, left shoulder: Secondary | ICD-10-CM | POA: Diagnosis not present

## 2016-05-27 MED ORDER — ACETAMINOPHEN 500 MG PO TABS
1000.0000 mg | ORAL_TABLET | Freq: Four times a day (QID) | ORAL | Status: DC | PRN
  Administered 2016-05-27: 1000 mg via ORAL
  Filled 2016-05-27: qty 2

## 2016-05-27 MED ORDER — TRAMADOL HCL 50 MG PO TABS: 50.0000 mg | ORAL_TABLET | Freq: Four times a day (QID) | ORAL | 0 refills | Status: AC | PRN

## 2016-05-27 MED ORDER — OXYCODONE HCL 5 MG PO TABS
5.0000 mg | ORAL_TABLET | Freq: Once | ORAL | Status: AC
  Administered 2016-05-27: 5 mg via ORAL
  Filled 2016-05-27: qty 1

## 2016-05-27 MED ORDER — DOCUSATE SODIUM 100 MG PO CAPS: 100.0000 mg | ORAL_CAPSULE | Freq: Two times a day (BID) | ORAL | 0 refills | Status: AC

## 2016-05-27 NOTE — Clinical Social Work Note (Signed)
Medicaid transportation arranged for patient to return home. Patient will need to be at main entrance by 3:45PM for pick up.   Medical Social Worker will sign off for now as social work intervention is no longer needed. Please consult [place] again if new need arises.  Glendon Axe, MSW [phone] 05/27/2016 3:32 PM

## 2016-05-27 NOTE — Discharge Instructions (Signed)
Discharge instructions following PCNL  Call your doctor for: Fevers greater than 100.5 Severe nausea or vomiting Increasing pain not controlled by pain medication Increasing redness or drainage from incisions Decreased urine output or a catheter is no longer draining  The number for questions is [phone].  Activity: Gradually increase activity with short frequent walks, 3-4 times a day.  Avoid strenuous activities, like sports, lawn-mowing, or heavy lifting (more than 10-15 pounds).  Wear loose, comfortable clothing that pull or kink the tube or tubes.  Do not drive while taking pain medication, or until your doctor permitts it.  Bathing and dressing changes: You may shower, not soak your back in a bathtub. You can remove the dressing in 48 hours.   Diet: It is extremely important to drink plenty of fluids after surgery, especially water.  You may resume your regular diet, unless otherwise instructed.  Medications: May take Tylenol (acetaminophen) or ibuprofen (Advil, Motrin) as directed over-the-counter. Take any prescriptions as directed.  Follow-up appointments: Follow-up appointment will be scheduled with Dr. Louis Meckel in 10-14 days for hospital check and stent removal.

## 2016-05-27 NOTE — Progress Notes (Signed)
Was given report on pt to try and keep nephrostomy tube clamped as long as tolerable. Pt was unable to keep it clamped throughout the night. Was given report that when/if the pt needed to have the nephrostomy unclamped to just leave it unclamped this time. Pt has complained of severe pain throughout the night stating that it was at a "20" or a "15" Pt nephrostomy has put out about 500cc of red urine throughout the night.  Carmela Hurt, RN 05/27/16 7:45 AM
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
## 2016-06-19 DIAGNOSIS — N200 Calculus of kidney: Secondary | ICD-10-CM | POA: Diagnosis not present

## 2016-08-03 ENCOUNTER — Telehealth: Payer: Self-pay

## 2016-08-03 NOTE — Telephone Encounter (Signed)
Called to find out if pt would like to have colonoscopy scheduled 

## 2016-08-27 DIAGNOSIS — N202 Calculus of kidney with calculus of ureter: Secondary | ICD-10-CM | POA: Diagnosis not present

## 2017-01-16 DIAGNOSIS — Z87442 Personal history of urinary calculi: Secondary | ICD-10-CM | POA: Diagnosis not present

## 2017-01-16 DIAGNOSIS — Z888 Allergy status to other drugs, medicaments and biological substances status: Secondary | ICD-10-CM | POA: Diagnosis not present

## 2017-01-16 DIAGNOSIS — R109 Unspecified abdominal pain: Secondary | ICD-10-CM | POA: Diagnosis not present

## 2017-01-16 DIAGNOSIS — R112 Nausea with vomiting, unspecified: Secondary | ICD-10-CM | POA: Diagnosis not present

## 2017-01-16 DIAGNOSIS — Z9049 Acquired absence of other specified parts of digestive tract: Secondary | ICD-10-CM | POA: Diagnosis not present

## 2017-01-16 DIAGNOSIS — R740 Nonspecific elevation of levels of transaminase and lactic acid dehydrogenase [LDH]: Secondary | ICD-10-CM | POA: Diagnosis not present

## 2017-01-16 DIAGNOSIS — R1013 Epigastric pain: Secondary | ICD-10-CM | POA: Diagnosis not present

## 2017-01-16 DIAGNOSIS — Z87891 Personal history of nicotine dependence: Secondary | ICD-10-CM | POA: Diagnosis not present

## 2017-01-16 DIAGNOSIS — K2970 Gastritis, unspecified, without bleeding: Secondary | ICD-10-CM | POA: Diagnosis not present

## 2017-03-02 DIAGNOSIS — J301 Allergic rhinitis due to pollen: Secondary | ICD-10-CM | POA: Diagnosis not present

## 2017-03-02 DIAGNOSIS — S46912A Strain of unspecified muscle, fascia and tendon at shoulder and upper arm level, left arm, initial encounter: Secondary | ICD-10-CM | POA: Diagnosis not present

## 2017-03-02 DIAGNOSIS — D1779 Benign lipomatous neoplasm of other sites: Secondary | ICD-10-CM | POA: Diagnosis not present

## 2017-05-04 DIAGNOSIS — R12 Heartburn: Secondary | ICD-10-CM | POA: Diagnosis not present

## 2017-05-04 DIAGNOSIS — R197 Diarrhea, unspecified: Secondary | ICD-10-CM | POA: Diagnosis not present

## 2017-05-04 DIAGNOSIS — R1011 Right upper quadrant pain: Secondary | ICD-10-CM | POA: Diagnosis not present

## 2017-05-04 DIAGNOSIS — R945 Abnormal results of liver function studies: Secondary | ICD-10-CM | POA: Diagnosis not present

## 2017-05-24 DIAGNOSIS — H524 Presbyopia: Secondary | ICD-10-CM | POA: Diagnosis not present

## 2017-05-24 DIAGNOSIS — E119 Type 2 diabetes mellitus without complications: Secondary | ICD-10-CM | POA: Diagnosis not present

## 2017-05-24 DIAGNOSIS — H5203 Hypermetropia, bilateral: Secondary | ICD-10-CM | POA: Diagnosis not present

## 2017-05-24 DIAGNOSIS — H2513 Age-related nuclear cataract, bilateral: Secondary | ICD-10-CM | POA: Diagnosis not present

## 2017-05-27 DIAGNOSIS — H9190 Unspecified hearing loss, unspecified ear: Secondary | ICD-10-CM | POA: Diagnosis present

## 2017-05-27 DIAGNOSIS — Z23 Encounter for immunization: Secondary | ICD-10-CM | POA: Diagnosis not present

## 2017-05-27 DIAGNOSIS — K5900 Constipation, unspecified: Secondary | ICD-10-CM | POA: Diagnosis not present

## 2017-05-27 DIAGNOSIS — K922 Gastrointestinal hemorrhage, unspecified: Secondary | ICD-10-CM | POA: Diagnosis not present

## 2017-05-27 DIAGNOSIS — R1011 Right upper quadrant pain: Secondary | ICD-10-CM | POA: Diagnosis present

## 2017-05-27 DIAGNOSIS — R1031 Right lower quadrant pain: Secondary | ICD-10-CM | POA: Diagnosis not present

## 2017-05-27 DIAGNOSIS — Z800 Family history of malignant neoplasm of digestive organs: Secondary | ICD-10-CM | POA: Diagnosis not present

## 2017-05-27 DIAGNOSIS — K644 Residual hemorrhoidal skin tags: Secondary | ICD-10-CM | POA: Diagnosis present

## 2017-05-27 DIAGNOSIS — R109 Unspecified abdominal pain: Secondary | ICD-10-CM | POA: Diagnosis not present

## 2017-05-27 DIAGNOSIS — Z743 Need for continuous supervision: Secondary | ICD-10-CM | POA: Diagnosis not present

## 2017-05-27 DIAGNOSIS — Z9119 Patient's noncompliance with other medical treatment and regimen: Secondary | ICD-10-CM | POA: Diagnosis not present

## 2017-05-27 DIAGNOSIS — F17290 Nicotine dependence, other tobacco product, uncomplicated: Secondary | ICD-10-CM | POA: Diagnosis present

## 2017-05-27 DIAGNOSIS — R001 Bradycardia, unspecified: Secondary | ICD-10-CM | POA: Diagnosis present

## 2017-05-27 DIAGNOSIS — R911 Solitary pulmonary nodule: Secondary | ICD-10-CM | POA: Diagnosis present

## 2017-05-28 DIAGNOSIS — R1011 Right upper quadrant pain: Secondary | ICD-10-CM | POA: Diagnosis not present

## 2017-05-28 DIAGNOSIS — Z23 Encounter for immunization: Secondary | ICD-10-CM | POA: Diagnosis not present

## 2017-05-28 DIAGNOSIS — R001 Bradycardia, unspecified: Secondary | ICD-10-CM | POA: Diagnosis not present

## 2017-05-28 DIAGNOSIS — R911 Solitary pulmonary nodule: Secondary | ICD-10-CM | POA: Diagnosis not present

## 2017-05-28 DIAGNOSIS — R109 Unspecified abdominal pain: Secondary | ICD-10-CM | POA: Diagnosis not present

## 2017-05-28 DIAGNOSIS — K922 Gastrointestinal hemorrhage, unspecified: Secondary | ICD-10-CM | POA: Diagnosis not present

## 2017-05-28 DIAGNOSIS — K644 Residual hemorrhoidal skin tags: Secondary | ICD-10-CM | POA: Diagnosis not present

## 2017-05-28 DIAGNOSIS — F17290 Nicotine dependence, other tobacco product, uncomplicated: Secondary | ICD-10-CM | POA: Diagnosis not present
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
